# Patient Record
Sex: Female | Born: 1940 | Race: White | Hispanic: No | Smoking: Former smoker
Health system: Southern US, Community
[De-identification: ages and names within clinical notes are randomized; demographics above are authoritative.]

## PROBLEM LIST (undated history)

## (undated) DIAGNOSIS — G3184 Mild cognitive impairment, so stated: Secondary | ICD-10-CM

## (undated) DIAGNOSIS — I10 Essential (primary) hypertension: Secondary | ICD-10-CM

## (undated) DIAGNOSIS — G309 Alzheimer's disease, unspecified: Secondary | ICD-10-CM

## (undated) DIAGNOSIS — H259 Unspecified age-related cataract: Secondary | ICD-10-CM

## (undated) DIAGNOSIS — R1013 Epigastric pain: Secondary | ICD-10-CM

## (undated) DIAGNOSIS — R635 Abnormal weight gain: Secondary | ICD-10-CM

## (undated) DIAGNOSIS — K3189 Other diseases of stomach and duodenum: Secondary | ICD-10-CM

## (undated) DIAGNOSIS — D509 Iron deficiency anemia, unspecified: Secondary | ICD-10-CM

## (undated) DIAGNOSIS — R634 Abnormal weight loss: Secondary | ICD-10-CM

## (undated) DIAGNOSIS — E785 Hyperlipidemia, unspecified: Secondary | ICD-10-CM

## (undated) DIAGNOSIS — F329 Major depressive disorder, single episode, unspecified: Secondary | ICD-10-CM

## (undated) DIAGNOSIS — M1990 Unspecified osteoarthritis, unspecified site: Secondary | ICD-10-CM

## (undated) DIAGNOSIS — K5900 Constipation, unspecified: Secondary | ICD-10-CM

## (undated) DIAGNOSIS — J449 Chronic obstructive pulmonary disease, unspecified: Secondary | ICD-10-CM

## (undated) DIAGNOSIS — F0280 Dementia in other diseases classified elsewhere without behavioral disturbance: Secondary | ICD-10-CM

## (undated) DIAGNOSIS — F22 Delusional disorders: Secondary | ICD-10-CM

## (undated) DIAGNOSIS — M21619 Bunion of unspecified foot: Secondary | ICD-10-CM

## (undated) DIAGNOSIS — I2510 Atherosclerotic heart disease of native coronary artery without angina pectoris: Secondary | ICD-10-CM

## (undated) DIAGNOSIS — S62101A Fracture of unspecified carpal bone, right wrist, initial encounter for closed fracture: Secondary | ICD-10-CM

## (undated) HISTORY — DX: Unspecified osteoarthritis, unspecified site: M19.90

## (undated) HISTORY — DX: Abnormal weight gain: R63.5

## (undated) HISTORY — PX: COSMETIC SURGERY: SHX468

## (undated) HISTORY — DX: Constipation, unspecified: K59.00

## (undated) HISTORY — DX: Abnormal weight loss: R63.4

## (undated) HISTORY — DX: Bunion of unspecified foot: M21.619

## (undated) HISTORY — DX: Other diseases of stomach and duodenum: K31.89

## (undated) HISTORY — DX: Fracture of unspecified carpal bone, right wrist, initial encounter for closed fracture: S62.101A

## (undated) HISTORY — DX: Essential (primary) hypertension: I10

## (undated) HISTORY — DX: Iron deficiency anemia, unspecified: D50.9

## (undated) HISTORY — DX: Alzheimer's disease, unspecified: G30.9

## (undated) HISTORY — DX: Chronic obstructive pulmonary disease, unspecified: J44.9

## (undated) HISTORY — DX: Hyperlipidemia, unspecified: E78.5

## (undated) HISTORY — DX: Delusional disorders: F22

## (undated) HISTORY — DX: Epigastric pain: R10.13

## (undated) HISTORY — DX: Dementia in other diseases classified elsewhere without behavioral disturbance: F02.80

## (undated) HISTORY — DX: Major depressive disorder, single episode, unspecified: F32.9

## (undated) HISTORY — DX: Mild cognitive impairment, so stated: G31.84

## (undated) HISTORY — DX: Atherosclerotic heart disease of native coronary artery without angina pectoris: I25.10

## (undated) HISTORY — DX: Unspecified age-related cataract: H25.9

---

## 2003-05-15 HISTORY — PX: CORONARY ARTERY BYPASS GRAFT: SHX141

## 2005-05-14 HISTORY — PX: BREAST SURGERY: SHX581

## 2005-05-14 HISTORY — PX: BUNIONECTOMY: SHX129

## 2005-05-14 HISTORY — PX: CARPAL TUNNEL RELEASE: SHX101

## 2007-12-24 ENCOUNTER — Ambulatory Visit (HOSPITAL_COMMUNITY): Admission: RE | Admit: 2007-12-24 | Discharge: 2007-12-24 | Payer: Self-pay | Admitting: Geriatric Medicine

## 2009-11-12 ENCOUNTER — Emergency Department (HOSPITAL_COMMUNITY): Admission: EM | Admit: 2009-11-12 | Discharge: 2009-11-13 | Payer: Self-pay | Admitting: Emergency Medicine

## 2010-07-30 LAB — POCT CARDIAC MARKERS
CKMB, poc: 1.9 ng/mL (ref 1.0–8.0)
Myoglobin, poc: 85.2 ng/mL (ref 12–200)
Troponin i, poc: <0.05 ng/mL (ref 0.00–0.09)

## 2010-07-30 LAB — CBC
HCT: 37.2 % (ref 36.0–46.0)
MCH: 33.8 pg (ref 26.0–34.0)
MCV: 98.6 fL (ref 78.0–100.0)
RBC: 3.77 MIL/uL — ABNORMAL LOW (ref 3.87–5.11)
WBC: 6.2 10*3/uL (ref 4.0–10.5)

## 2010-07-30 LAB — LIPASE, BLOOD: Lipase: 33 U/L (ref 11–59)

## 2010-07-30 LAB — COMPREHENSIVE METABOLIC PANEL
ALT: 38 U/L — ABNORMAL HIGH (ref 0–35)
AST: 47 U/L — ABNORMAL HIGH (ref 0–37)
Albumin: 3.8 g/dL (ref 3.5–5.2)
CO2: 27 mEq/L (ref 19–32)
GFR calc Af Amer: >60 mL/min (ref >60)
GFR calc non Af Amer: >60 mL/min (ref >60)
Glucose, Bld: 86 mg/dL (ref 70–99)
Sodium: 141 mEq/L (ref 135–145)
Total Protein: 7 g/dL (ref 6.0–8.3)

## 2010-07-30 LAB — DIFFERENTIAL
Basophils Absolute: 0 10*3/uL (ref 0.0–0.1)
Eosinophils Relative: 3 % (ref 0–5)
Lymphocytes Relative: 44 % (ref 12–46)
Monocytes Relative: 9 % (ref 3–12)
Neutro Abs: 2.7 10*3/uL (ref 1.7–7.7)
Neutrophils Relative %: 44 % (ref 43–77)

## 2010-10-24 DIAGNOSIS — F3289 Other specified depressive episodes: Secondary | ICD-10-CM

## 2010-10-24 DIAGNOSIS — D509 Iron deficiency anemia, unspecified: Secondary | ICD-10-CM

## 2010-10-24 DIAGNOSIS — J4489 Other specified chronic obstructive pulmonary disease: Secondary | ICD-10-CM

## 2010-10-24 DIAGNOSIS — F329 Major depressive disorder, single episode, unspecified: Secondary | ICD-10-CM

## 2010-10-24 DIAGNOSIS — E785 Hyperlipidemia, unspecified: Secondary | ICD-10-CM

## 2010-10-24 DIAGNOSIS — I10 Essential (primary) hypertension: Secondary | ICD-10-CM

## 2010-10-24 DIAGNOSIS — J449 Chronic obstructive pulmonary disease, unspecified: Secondary | ICD-10-CM

## 2010-10-24 HISTORY — DX: Other specified depressive episodes: F32.89

## 2010-10-24 HISTORY — DX: Hyperlipidemia, unspecified: E78.5

## 2010-10-24 HISTORY — DX: Iron deficiency anemia, unspecified: D50.9

## 2010-10-24 HISTORY — DX: Other specified chronic obstructive pulmonary disease: J44.89

## 2010-10-24 HISTORY — DX: Chronic obstructive pulmonary disease, unspecified: J44.9

## 2010-10-24 HISTORY — DX: Essential (primary) hypertension: I10

## 2010-10-24 HISTORY — DX: Major depressive disorder, single episode, unspecified: F32.9

## 2010-10-25 DIAGNOSIS — R635 Abnormal weight gain: Secondary | ICD-10-CM

## 2010-10-25 HISTORY — DX: Abnormal weight gain: R63.5

## 2010-10-31 DIAGNOSIS — M21619 Bunion of unspecified foot: Secondary | ICD-10-CM

## 2010-10-31 DIAGNOSIS — G3184 Mild cognitive impairment, so stated: Secondary | ICD-10-CM

## 2010-10-31 DIAGNOSIS — H259 Unspecified age-related cataract: Secondary | ICD-10-CM

## 2010-10-31 DIAGNOSIS — I2510 Atherosclerotic heart disease of native coronary artery without angina pectoris: Secondary | ICD-10-CM | POA: Insufficient documentation

## 2010-10-31 HISTORY — DX: Bunion of unspecified foot: M21.619

## 2010-10-31 HISTORY — DX: Atherosclerotic heart disease of native coronary artery without angina pectoris: I25.10

## 2010-10-31 HISTORY — DX: Mild cognitive impairment of uncertain or unknown etiology: G31.84

## 2010-10-31 HISTORY — DX: Unspecified age-related cataract: H25.9

## 2010-11-13 DIAGNOSIS — F0280 Dementia in other diseases classified elsewhere without behavioral disturbance: Secondary | ICD-10-CM

## 2010-11-13 HISTORY — DX: Dementia in other diseases classified elsewhere, unspecified severity, without behavioral disturbance, psychotic disturbance, mood disturbance, and anxiety: F02.80

## 2011-03-14 DIAGNOSIS — F22 Delusional disorders: Secondary | ICD-10-CM

## 2011-03-14 HISTORY — DX: Delusional disorders: F22

## 2011-06-04 DIAGNOSIS — K3189 Other diseases of stomach and duodenum: Secondary | ICD-10-CM

## 2011-06-04 DIAGNOSIS — M1990 Unspecified osteoarthritis, unspecified site: Secondary | ICD-10-CM

## 2011-06-04 HISTORY — DX: Other diseases of stomach and duodenum: K31.89

## 2011-06-04 HISTORY — DX: Unspecified osteoarthritis, unspecified site: M19.90

## 2012-01-07 IMAGING — CR DG CHEST 1V PORT
1 series · 1 of 1 positions shown · non-contrast
Comparison: None.

CLINICAL DATA: Mid chest pain and shortness of breath; shakiness
and weakness.

PORTABLE CHEST - 1 VIEW

[series 1]
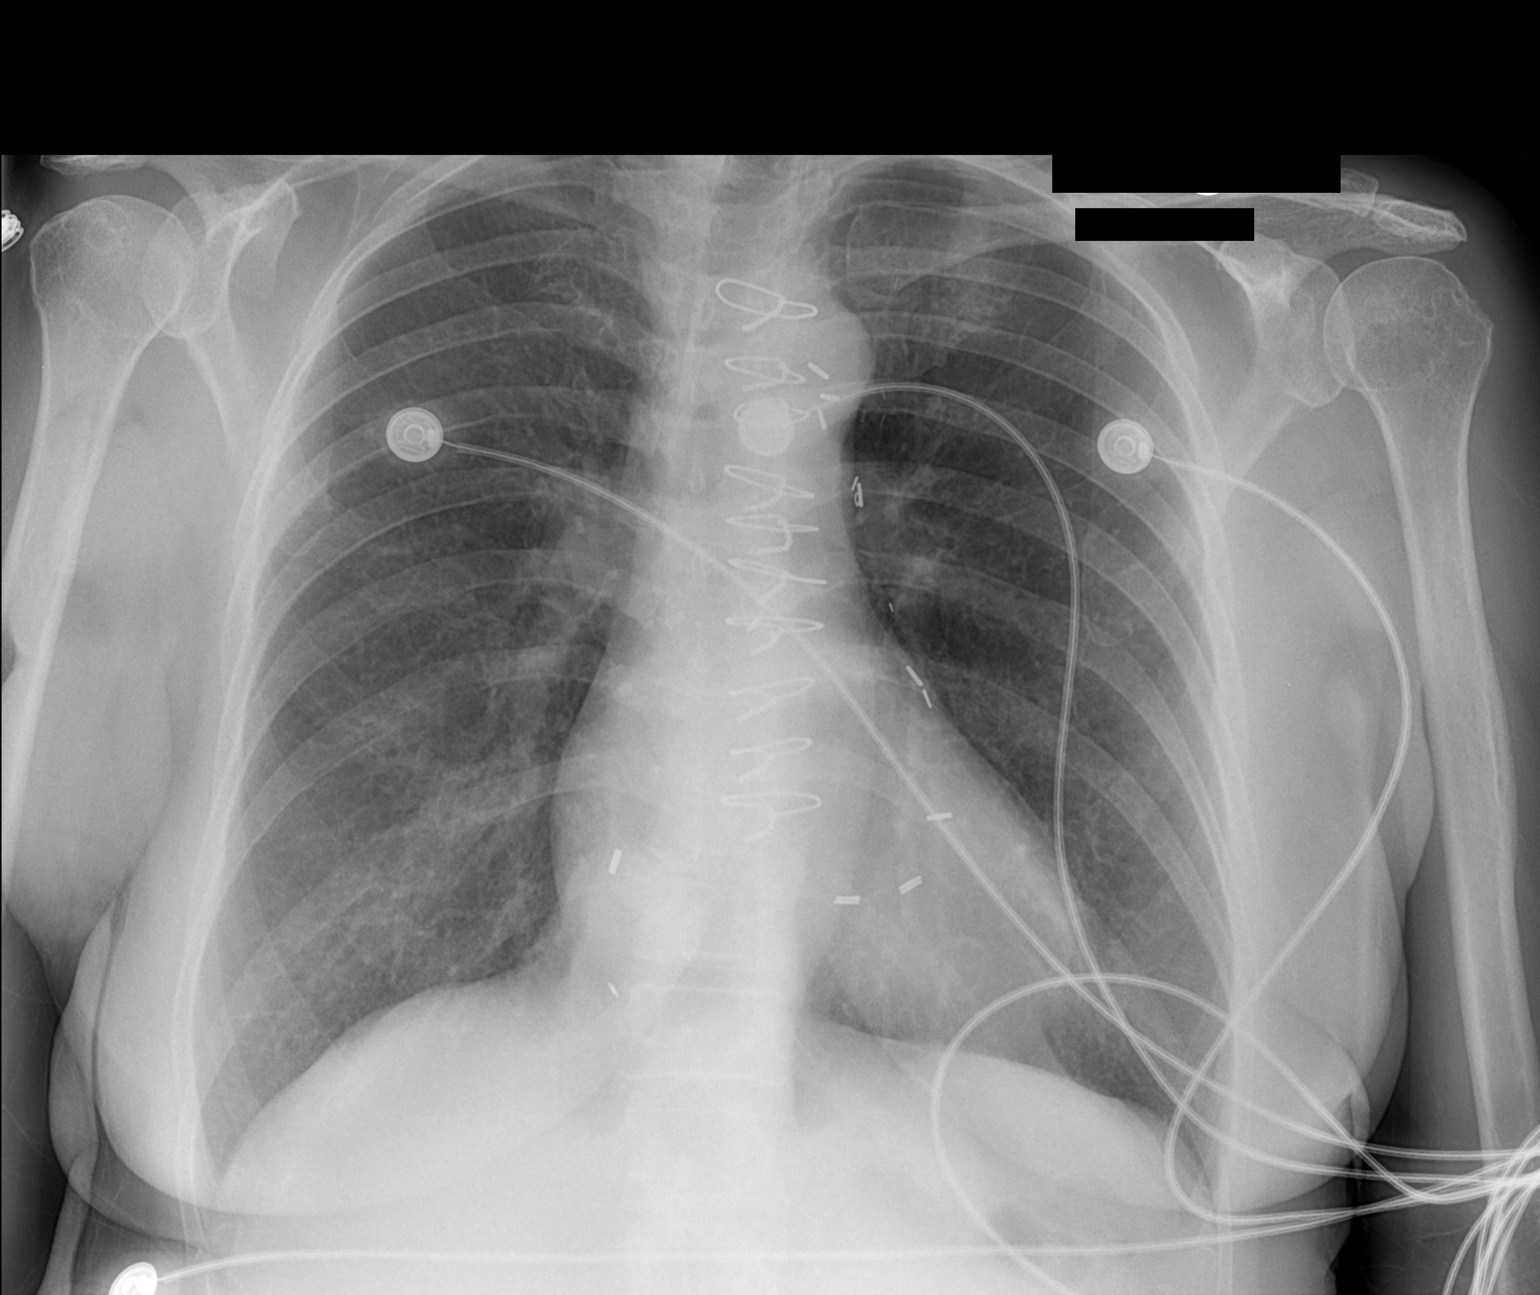

[1 of 1 positions shown; findings below may reference images not displayed]

FINDINGS: The lungs are well-aerated.  Mildly increased
interstitial markings are noted, of uncertain chronicity.  No
definite pulmonary edema is seen.  There is no evidence of focal
opacification, pleural effusion or pneumothorax.

The cardiomediastinal silhouette is normal in size; the patient is
status post median sternotomy.  No acute osseous abnormalities are
seen.
IMPRESSION: Mildly increased interstitial markings, possibly chronic in nature;
lungs otherwise clear.  No definite pulmonary edema seen.  The
heart remains normal in size.

## 2012-06-24 DIAGNOSIS — K5900 Constipation, unspecified: Secondary | ICD-10-CM | POA: Insufficient documentation

## 2012-06-24 DIAGNOSIS — R634 Abnormal weight loss: Secondary | ICD-10-CM | POA: Insufficient documentation

## 2012-06-24 HISTORY — DX: Abnormal weight loss: R63.4

## 2012-06-24 HISTORY — DX: Constipation, unspecified: K59.00

## 2012-10-17 ENCOUNTER — Encounter: Payer: Self-pay | Admitting: *Deleted

## 2012-11-25 ENCOUNTER — Non-Acute Institutional Stay (SKILLED_NURSING_FACILITY): Payer: Medicare Other | Admitting: Geriatric Medicine

## 2012-11-25 ENCOUNTER — Encounter: Payer: Self-pay | Admitting: Geriatric Medicine

## 2012-11-25 DIAGNOSIS — F0280 Dementia in other diseases classified elsewhere without behavioral disturbance: Secondary | ICD-10-CM

## 2012-11-25 DIAGNOSIS — E785 Hyperlipidemia, unspecified: Secondary | ICD-10-CM

## 2012-11-25 DIAGNOSIS — K5900 Constipation, unspecified: Secondary | ICD-10-CM

## 2012-11-25 DIAGNOSIS — G309 Alzheimer's disease, unspecified: Secondary | ICD-10-CM

## 2012-11-25 DIAGNOSIS — I2510 Atherosclerotic heart disease of native coronary artery without angina pectoris: Secondary | ICD-10-CM

## 2012-11-25 DIAGNOSIS — R634 Abnormal weight loss: Secondary | ICD-10-CM

## 2012-11-25 DIAGNOSIS — F3289 Other specified depressive episodes: Secondary | ICD-10-CM

## 2012-11-25 DIAGNOSIS — F329 Major depressive disorder, single episode, unspecified: Secondary | ICD-10-CM

## 2012-11-25 NOTE — Progress Notes (Signed)
Patient ID: Sharon Harper, female   DOB: [birth year], 72 y.o.   MRN: [id number] [facility] [phone])  Chief Complaint  Patient presents with  . Medical Managment of Chronic Issues    AD    HPI: This is a 72 y.o. female resident of [facility], Memory Care section evaluated today for management of ongoing medical issues.  Review of record shows patient's vital signs have been stable, p.o. intake has improved, weight has improved 10 pounds over the last few months. Patient is participating in some unit activities, continues to be active daily with her caregiver. Functional status is stable, though language is a bit more impaired.   Bathing: Moderate Assist Bed Mobility: Independent Bladder Management: Continent, Bowel Management: Continent Feeding: Agricultural engineer and Grooming: Supervision, Toileting / Clothing: Minimal Assist Transfers: Independent, Walk: Independent   Allergies no known allergies Medications    Medication List       This list is accurate as of: 11/25/12 11:59 PM.  Always use your most recent med list.               aspirin 81 MG tablet  Take 81 mg by mouth daily. Take one daily to prevent stroke, and heart attack, blood clot.     docusate sodium 100 MG capsule  Commonly known as:  COLACE  Take 100 mg by mouth 2 (two) times daily. Take 2 tablets once daily at bedtime for stool softener.     donepezil 10 MG tablet  Commonly known as:  ARICEPT  Take 10 mg by mouth at bedtime as needed. Take 1 daily for memory.     Fluticasone-Salmeterol 100-50 MCG/DOSE Aepb  Commonly known as:  ADVAIR  Inhale 1 puff into the lungs every 12 (twelve) hours. Take 1 puff twice daily.     LORazepam 0.5 MG tablet  Commonly known as:  ATIVAN  Take 0.5 mg by mouth every 8 (eight) hours. Take one tablet every 8 hours as needed for anxiety/agitation.     metoprolol succinate 25 MG 24 hr tablet  Commonly known as:  TOPROL-XL  Take 25 mg  by mouth daily. Take 1 daily for blood pressure.     mirtazapine 15 MG tablet  Commonly known as:  REMERON  Take 15 mg by mouth at bedtime. Take 1 tablet at bedtime to increase appetite, decrease anxiety.     multivitamin tablet  Take 1 tablet by mouth daily. Take 1 tablet daily for vitamin supplement.     NAMENDA XR 28 MG Cp24  Generic drug:  Memantine HCl ER  Take by mouth. Take 1 daily to help maintain functional status.     ranitidine 75 MG tablet  Commonly known as:  ZANTAC  Take 75 mg by mouth 2 (two) times daily. Take 1 tablet daily for GERD.        DATA REVIEWED  Radiologic Exams:   Cardiovascular Exams:   Laboratory Studies: 02/21/12 WBC 5.8, Hgb 11.9, Hct 34.7, Plt 179 MCH 32.8, MCV 95.6 Glu 104, BUN 9, Cr. .86, Na 145, K+ 4.0. protein/LFTs WNL TC 167. TG 52, HDL 75, LDL 82 TSH 2.32  Review of Systems   DATA OBTAINED: from patient, nurse, medical record,  GENERAL: Feels well   No fevers, fatigue, Increased appetite/ weight SKIN: No itch, rash or open wounds EYES: No eye pain, dryness or itching  No change in vision EARS: No earache, change in hearing NOSE: No congestion, drainage or bleeding MOUTH/THROAT: No mouth or tooth  pain  No sore throat No difficulty chewing or swallowing RESPIRATORY: No cough, wheezing, SOB CARDIAC: No chest pain, palpitations  No edema. GI: No abdominal pain  No N/V/D or constipation  No heartburn or reflux  GU: No dysuria, frequency or urgency  No change in urine volume or character  MUSCULOSKELETAL: No joint pain, swelling or stiffness  No back pain  No muscle ache, pain, weakness  Gait is steady  No recent falls.  NEUROLOGIC: No dizziness, fainting, headache No change in mental status (dementia).  PSYCHIATRIC: No feelings of anxiety, depression Sleeps well.  No behavior issue.   Physical Exam Filed Vitals:   11/25/12 1154  BP: 96/59  Pulse: 82  Temp: 98.3 F (36.8 C)  Resp: 18  Weight: 131 lb (59.421 kg)   There is no  height on file to calculate BMI.  GENERAL APPEARANCE: No acute distress, appropriately groomed, normal body habitus. Alert, pleasant, conversant.Word finding difficulty evident SKIN: No diaphoresis, rash, unusual lesions, wounds HEAD: Normocephalic, atraumatic EYES: Conjunctiva/lids clear. Pupils round, reactive.  EARS: External exam WNL,  Hearing grossly normal. NOSE: No deformity or discharge. MOUTH/THROAT: Lips w/o lesions. Oral mucosa, tongue moist, w/o lesion. Oropharynx w/o redness or lesions.  NECK: Supple, full ROM. No thyroid tenderness, enlargement or nodule LYMPHATICS: No head, neck or supraclavicular adenopathy RESPIRATORY: Breathing is even, unlabored. Lung sounds are clear and full.  CARDIOVASCULAR: Heart RRR. No murmur or extra heart sounds  ARTERIAL: No carotid, bruit.  DP pulse 2+.  VENOUS: No varicosities. No venous stasis skin changes  EDEMA: No peripheral edema.  GASTROINTESTINAL: Abdomen is soft, non-tender, not distended w/ normal bowel sounds. MUSCULOSKELETAL: Moves all extremities with full ROM, strength and tone. Back is without kyphosis, scoliosis or spinal process tenderness. Gait is steady NEUROLOGIC: Not oriented to time, place. Speech clear, language impaired. No tremor.   PSYCHIATRIC: Mood and affect appropriate to situation  ASSESSMENT/PLAN   Alzheimer's disease Reviewed most recent MDS, 09/2012: BIMS 4/13, PHQ-9 1/27. Some wandering behavior is noted. Patient's functional status remains stable; she requires supervision or limited assistance with ADLs. She continues to ambulate safely independently. Language is more impaired, continues to be able to make needs known. Patient's overall demeanor is very good, markedly less anxious. P.O. intake and weight have improved. Mirtazapine was added 3/ 2014 may be helping both appetite and anxiety. Patient participates in some in activities, continues to have a caregiver  each afternoon, family remains involved, visit  freqiuently.  Coronary atherosclerosis of native coronary artery No complaints of chest pain or irregular heart rhythm. Blood pressure/pulse remain stable. Continue low-dose beta blocker.  Loss of weight Improved p.o. intake over the last several months, weight is up 10 pounds since April 2014. Mirtazapine was added March 2014, continue medication.    Follow up: Routine or as needed Lab 11/27/2012 CBC, CMP, TSH  Liliana Brentlinger T.Samit Sylve, NP-C 11/25/2012

## 2012-12-04 ENCOUNTER — Encounter: Payer: Self-pay | Admitting: Geriatric Medicine

## 2012-12-04 ENCOUNTER — Other Ambulatory Visit: Payer: Self-pay | Admitting: Geriatric Medicine

## 2012-12-04 DIAGNOSIS — Z66 Do not resuscitate: Secondary | ICD-10-CM

## 2012-12-04 NOTE — Assessment & Plan Note (Signed)
Improved p.o. intake over the last several months, weight is up 10 pounds since April 2014. Mirtazapine was added March 2014, continue medication.

## 2012-12-04 NOTE — Assessment & Plan Note (Signed)
No complaints of chest pain or irregular heart rhythm. Blood pressure/pulse remain stable. Continue low-dose beta blocker.

## 2012-12-04 NOTE — Assessment & Plan Note (Addendum)
Reviewed most recent MDS, 09/2012: BIMS 4/13, PHQ-9 1/27. Some wandering behavior is noted. Patient's functional status remains stable; she requires supervision or limited assistance with ADLs. She continues to ambulate safely independently. Language is more impaired, continues to be able to make needs known. Patient's overall demeanor is very good, markedly less anxious. P.O. intake and weight have improved. Mirtazapine was added 3/ 2014 may be helping both appetite and anxiety. Patient participates in some in activities, continues to have a caregiver  each afternoon, family remains involved, visit freqiuently.

## 2013-03-08 DIAGNOSIS — J189 Pneumonia, unspecified organism: Secondary | ICD-10-CM | POA: Insufficient documentation

## 2013-03-10 ENCOUNTER — Non-Acute Institutional Stay: Payer: Medicare Other | Admitting: Geriatric Medicine

## 2013-03-10 ENCOUNTER — Encounter: Payer: Self-pay | Admitting: Geriatric Medicine

## 2013-03-10 DIAGNOSIS — J189 Pneumonia, unspecified organism: Secondary | ICD-10-CM

## 2013-03-10 DIAGNOSIS — N390 Urinary tract infection, site not specified: Secondary | ICD-10-CM

## 2013-03-10 NOTE — Progress Notes (Signed)
Patient ID: Sharon Harper, female   DOB: 27-Oct-1940, 72 y.o.   MRN: [id number] [facility] [phone])  Chief Complaint  Patient presents with  . Pneumonia    HPI: This is a 72 y.o. female resident of [facility],  Memory Care section evaluated today in follow up of pneumonia diagnosed by CXR 03/08/13. Review of facility record shows patient with increased agitation 10/23, 10/24. Gait noted to be unsteady and shuffling starting 10/24, cough started 10/26, had incontinent urine episode and low grade fever. OnCall provider was notified, CXR U/A ordered. Avelox started.  Today, patient is afebrile, minimal coughing, gait improved though not back to baseline. Mood remains negative.    Functional Status Bathing: Moderate Assist Bed Mobility: Independent Bladder Management: Continent, Bowel Management: Continent Feeding: Agricultural engineer and Grooming: Supervision, Toileting / Clothing: Minimal Assist Transfers: Independent, Walk: Independent   No Known Allergies    Medication List       This list is accurate as of: 03/10/13  2:54 PM.  Always use your most recent med list.               aspirin 81 MG tablet  Take 81 mg by mouth daily. Take one daily to prevent stroke, and heart attack, blood clot.     docusate sodium 100 MG capsule  Commonly known as:  COLACE  Take 100 mg by mouth 2 (two) times daily. Take 2 tablets once daily at bedtime for stool softener.     donepezil 10 MG tablet  Commonly known as:  ARICEPT  Take 10 mg by mouth at bedtime as needed. Take 1 daily for memory.     Fluticasone-Salmeterol 100-50 MCG/DOSE Aepb  Commonly known as:  ADVAIR  Inhale 1 puff into the lungs every 12 (twelve) hours. Take 1 puff twice daily.     LORazepam 0.5 MG tablet  Commonly known as:  ATIVAN  Take 0.5 mg by mouth every 8 (eight) hours. Take one tablet every 8 hours as needed for anxiety/agitation.     metoprolol succinate 25 MG 24 hr  tablet  Commonly known as:  TOPROL-XL  Take 25 mg by mouth daily. Take 1 daily for blood pressure.     mirtazapine 15 MG tablet  Commonly known as:  REMERON  Take 15 mg by mouth at bedtime. Take 1 tablet at bedtime to increase appetite, decrease anxiety.     multivitamin tablet  Take 1 tablet by mouth daily. Take 1 tablet daily for vitamin supplement.     NAMENDA XR 28 MG Cp24  Generic drug:  Memantine HCl ER  Take by mouth. Take 1 daily to help maintain functional status.     ranitidine 75 MG tablet  Commonly known as:  ZANTAC  Take 75 mg by mouth 2 (two) times daily. Take 1 tablet daily for GERD.        DATA REVIEWED  Radiologic Exams  [facility] X-ray 03/08/2013 CXR: Dense infiltrate above left hemidiaphragm c/w PNA. No effusion  Cardiovascular Exams:   Laboratory Studies: 02/21/12 WBC 5.8, Hgb 11.9, Hct 34.7, Plt 179 MCH 32.8, MCV 95.6 Glu 104, BUN 9, Cr. .86, Na 145, K+ 4.0. protein/LFTs WNL TC 167. TG 52, HDL 75, LDL 82 TSH 2.32  11/27/2012: WBC 6.9, Hgb 12.5, Hct 36.8, Plt 254  Glu 90, BUN 16, Cr. .93, na 142, K+ 4.3. Protein/ LFTs WNL  TSH 2.61  03/10/2013 urine cx: >100,000 E. Coli Review of Systems   DATA OBTAINED: from patient,  nurse, medical record,  GENERAL: Feels "better"  No fevers, fatigue, Appetite fair SKIN: No itch, rash or open wounds EYES: No eye pain, dryness or itching  No change in vision EARS: No earache, change in hearing NOSE: No congestion, drainage or bleeding MOUTH/THROAT: No mouth or tooth pain  No sore throat No difficulty chewing or swallowing RESPIRATORY: Cough persists, No wheezing, SOB CARDIAC: No chest pain, palpitations  No edema. GI: No abdominal pain  No N/V/D or constipation  No heartburn or reflux  GU: No dysuria, frequency or urgency  No change in urine volume or character  MUSCULOSKELETAL: No joint pain, swelling or stiffness  No back pain  No muscle ache, pain, weakness  Gait is steady with walker  No recent falls.   NEUROLOGIC: No dizziness, fainting, headache No change in mental status (dementia).  PSYCHIATRIC: Mild agitation  Sleeps well.  No behavior issue.   Physical Exam Filed Vitals:   03/10/13 1453  BP: 130/62  Pulse: 88  Temp: 96.8 F (36 C)  Resp: 16   There is no weight on file to calculate BMI.  GENERAL APPEARANCE: No acute distress, appropriately groomed, normal body habitus. Alert, pleasant, conversant.Word finding difficulty evident SKIN: No diaphoresis, rash, unusual lesions, wounds HEAD: Normocephalic, atraumatic EYES: Conjunctiva/lids clear. Pupils round, reactive.  EARS: External exam WNL,  Hearing grossly normal. NOSE: No deformity or discharge. MOUTH/THROAT: Lips w/o lesions. Oral mucosa, tongue moist, w/o lesion. Oropharynx w/o redness or lesions.  NECK: Supple, full ROM. No thyroid tenderness, enlargement or nodule LYMPHATICS: No head, neck or supraclavicular adenopathy RESPIRATORY: Breathing is even, unlabored. Lung sounds are clear and full.  CARDIOVASCULAR: Heart RRR. No murmur or extra heart sounds  EDEMA: No peripheral edema.  GASTROINTESTINAL: Abdomen is soft, non-tender, not distended w/ normal bowel sounds. MUSCULOSKELETAL: Moves all extremities with full ROM, strength and tone. Back is without kyphosis, scoliosis or spinal process tenderness. Gait is steady w/ walker NEUROLOGIC: Not oriented to time, place. Speech clear, language impaired. No tremor.   PSYCHIATRIC: Mild agitation    ASSESSMENT/PLAN   HCAP (healthcare-associated pneumonia) Cough, gait and appetite improving. Continue Avelox for 10 days  Urinary tract infection, site not specified UTI with E.Coli, sensitive to cipro/levofloxacin. Avelox will cover, continue for full 10 days.    Follow up: Routine or as needed  Sharon Satterly T.[name], NP-C 03/10/2013

## 2013-03-10 NOTE — Assessment & Plan Note (Addendum)
Cough, gait and appetite improving. Continue Avelox for 10 days

## 2013-03-12 DIAGNOSIS — F0391 Unspecified dementia with behavioral disturbance: Secondary | ICD-10-CM | POA: Insufficient documentation

## 2013-03-15 DIAGNOSIS — N390 Urinary tract infection, site not specified: Secondary | ICD-10-CM | POA: Insufficient documentation

## 2013-03-15 NOTE — Assessment & Plan Note (Signed)
UTI with E.Coli, sensitive to cipro/levofloxacin. Avelox will cover, continue for full 10 days.

## 2013-04-17 LAB — BASIC METABOLIC PANEL
BUN: 12 mg/dL (ref 4–21)
CREATININE: 0.8 mg/dL (ref 0.5–1.1)
GLUCOSE: 98 mg/dL
Potassium: 3.8 mmol/L (ref 3.4–5.3)
SODIUM: 140 mmol/L (ref 137–147)

## 2013-04-17 LAB — HEPATIC FUNCTION PANEL
ALT: 15 U/L (ref 7–35)
AST: 23 U/L (ref 13–35)
Alkaline Phosphatase: 93 U/L (ref 25–125)

## 2013-04-17 LAB — CBC AND DIFFERENTIAL
HEMATOCRIT: 37 % (ref 36–46)
HEMOGLOBIN: 12.7 g/dL (ref 12.0–16.0)
PLATELETS: 298 10*3/uL (ref 150–399)
WBC: 5.7 10*3/mL

## 2013-04-17 LAB — TSH: TSH: 3.24 u[IU]/mL (ref 0.41–5.90)

## 2013-06-09 ENCOUNTER — Encounter: Payer: Self-pay | Admitting: Adult Health

## 2013-06-09 ENCOUNTER — Encounter: Payer: Self-pay | Admitting: Geriatric Medicine

## 2013-06-09 ENCOUNTER — Non-Acute Institutional Stay: Payer: Medicare Other | Admitting: Adult Health

## 2013-06-09 DIAGNOSIS — R269 Unspecified abnormalities of gait and mobility: Secondary | ICD-10-CM

## 2013-06-09 DIAGNOSIS — R0602 Shortness of breath: Secondary | ICD-10-CM

## 2013-06-09 DIAGNOSIS — F329 Major depressive disorder, single episode, unspecified: Secondary | ICD-10-CM

## 2013-06-09 DIAGNOSIS — G309 Alzheimer's disease, unspecified: Principal | ICD-10-CM

## 2013-06-09 DIAGNOSIS — F03918 Unspecified dementia, unspecified severity, with other behavioral disturbance: Secondary | ICD-10-CM

## 2013-06-09 DIAGNOSIS — R609 Edema, unspecified: Secondary | ICD-10-CM

## 2013-06-09 DIAGNOSIS — F0280 Dementia in other diseases classified elsewhere without behavioral disturbance: Secondary | ICD-10-CM

## 2013-06-09 DIAGNOSIS — F3289 Other specified depressive episodes: Secondary | ICD-10-CM

## 2013-06-09 DIAGNOSIS — F0391 Unspecified dementia with behavioral disturbance: Secondary | ICD-10-CM

## 2013-06-09 NOTE — Assessment & Plan Note (Signed)
06/09/13: increasing agitation and irritability. Depakote reduced to 250mg  from 500mg  to see if improves

## 2013-06-09 NOTE — Assessment & Plan Note (Signed)
06/09/13: pt with gait difficulty dating back to October 2014 prior to depakote order, however, nursing and friend, carolyn, reported marked decline in ability to ambulate safely, with at times requiring walker and physical assistance. Medication vs. Progressive dementia. Will consider PT if needed. Continue supervison with ambulation and walker when needed. Decrease depakote to 250mg  from 500mg  daily

## 2013-06-09 NOTE — Assessment & Plan Note (Signed)
06/09/13: increased crying and irritability at times. Medication vs. Progressive dementia. Will decrease depakote to 250mg  from 500mg  daily to see if mood improves

## 2013-06-09 NOTE — Progress Notes (Signed)
Patient ID: Sharon Harper, female   DOB: 25-Feb-1941, 73 y.o.   MRN: [id number] [facility] [phone])  Chief Complaint  Patient presents with  . Medical Managment of Chronic Issues    annual update    HPI: This is 73 y.o. Patient in memory care assessed for annual comprehensive exam. Staff reports increased difficulty in walking resulting in 1 fall without injury on 05/24/13, and several other near fall incidents. Also reported is pt difficulty understanding how to take her pills and becoming agitated. Pt friend, Sharon Harper, is at side and reports pt has had some more crying spells in the last several months, along with her notable walking difficulty. She has had to assist pt with use of walker at times due to severe unsteady gait. She notes patient has never returned to baseline mood and walking since having UTI and pneumonia in October 2014. It is also reported that pt has become more sob with minimal activity in recent weeks  Functional Status Bathing: Moderate Assist Bed Mobility: Minimal Assist Bladder Management: Frequently incontinent, Bowel Management: Continent Feeding: Supervision Hygiene and Grooming: Supervision, Toileting / Clothing: Minimal Assist Transfers: Supervision, Walk: Supervision/Minimal to moderate assistance   No Known Allergies    Medication List       This list is accurate as of: 06/09/13  4:38 PM.  Always use your most recent med list.               aspirin 81 MG tablet  Take 81 mg by mouth daily. Take one daily to prevent stroke, and heart attack, blood clot.     divalproex 250 MG 24 hr tablet  Commonly known as:  DEPAKOTE ER  Take 250 mg by mouth every morning.     docusate sodium 100 MG capsule  Commonly known as:  COLACE  Take 100 mg by mouth 2 (two) times daily. Take 2 tablets once daily at bedtime for stool softener.     donepezil 10 MG tablet  Commonly known as:  ARICEPT  Take 10 mg by mouth at bedtime. Take 1 daily  for memory.     Fluticasone-Salmeterol 100-50 MCG/DOSE Aepb  Commonly known as:  ADVAIR  Inhale 1 puff into the lungs every 12 (twelve) hours. Take 1 puff twice daily.     LORazepam 0.5 MG tablet  Commonly known as:  ATIVAN  Take 0.5 mg by mouth every 8 (eight) hours. Take one tablet every 8 hours as needed for anxiety/agitation.     metoprolol succinate 25 MG 24 hr tablet  Commonly known as:  TOPROL-XL  Take 25 mg by mouth daily. Take 1 daily for blood pressure.     mirtazapine 15 MG tablet  Commonly known as:  REMERON  Take 15 mg by mouth at bedtime. Take 1 tablet at bedtime to increase appetite, decrease anxiety.     multivitamin tablet  Take 1 tablet by mouth daily. Take 1 tablet daily for vitamin supplement.     NAMENDA XR 28 MG Cp24  Generic drug:  Memantine HCl ER  Take by mouth every morning. Take 1 daily to help maintain functional status.     ranitidine 75 MG tablet  Commonly known as:  ZANTAC  Take 75 mg by mouth 2 (two) times daily. Take 1 tablet daily for GERD.        DATA REVIEWED  Radiologic Exams  [facility] X-ray 03/08/2013 CXR: Dense infiltrate above left hemidiaphragm c/w PNA. No effusion  Cardiovascular Exams:  Laboratory Studies: 02/21/12 WBC 5.8, Hgb 11.9, Hct 34.7, Plt 179 MCH 32.8, MCV 95.6 Glu 104, BUN 9, Cr. .86, Na 145, K+ 4.0. protein/LFTs WNL TC 167. TG 52, HDL 75, LDL 82 TSH 2.32  11/27/2012: WBC 6.9, Hgb 12.5, Hct 36.8, Plt 254  Glu 90, BUN 16, Cr. .93, na 142, K+ 4.3. Protein/ LFTs WNL  TSH 2.61  03/10/2013 urine cx: >100,000 E. Coli Lab Results- [facility] 04/17/2013  Component Value   WBC 5.7   HGB 12.7   HCT 37   PLT 298       GLUCOSE 98   ALT 15   AST 23   NA 140   K 3.8   CREATININE 0.8   BUN 12   albumin 3.8   GFR 71       Valproic Acid 46   TSH 3.24     Past Medical History  Diagnosis Date  . Unspecified constipation 06/24/2012  . Loss of weight 06/24/2012  . Dyspepsia and other specified disorders of  function of stomach 06/04/2011  . Osteoarthrosis, unspecified whether generalized or localized, unspecified site 06/04/2011  . Unspecified paranoid state 03/14/2011  . Alzheimer's disease 11/13/2010  . Mild cognitive impairment, so stated 10/31/2010  . Senile cataract, unspecified 10/31/2010  . Coronary atherosclerosis of native coronary artery 10/31/2010  . Bunion 10/31/2010  . Abnormal weight gain 10/25/2010  . Other and unspecified hyperlipidemia 10/24/2010  . Iron deficiency anemia, unspecified 10/24/2010  . Depressive disorder, not elsewhere classified 10/24/2010  . Unspecified essential hypertension 10/24/2010  . Chronic airway obstruction, not elsewhere classified 10/24/2010   Past Surgical History  Procedure Laterality Date  . Cosmetic surgery  2000-2005    multiple surgeries  . Coronary artery bypass graft  2005  . Carpal tunnel release  2007  . Breast surgery  2007    Breast reduction  . Bunionectomy Bilateral 2007   Family Status  Relation Status Death Age  . Brother Deceased [id number]    Alzheimers  . Daughter Alive    History   Social History Narrative   Divorced. Retired, Research officer, political partye[employer] Estate [facility]. Patient has Advanced planning documents:  Living Will, DNR    Stopped smoking 1983, minimal alcohol history          REVIEW OF SYSTEMS DATA OBTAINED: from patient, nurse, medical record,  GENERAL: Feels "fantastic" , eating well Friend, Sharon JonesCarolyn, states pt has had more crying spells, but none in about a week SKIN: No itch, rash or open wounds EYES: No eye pain, dryness or itching  No change in vision EARS: No earache, change in hearing NOSE: No congestion, drainage or bleeding MOUTH/THROAT: No mouth or tooth pain  No sore throat No difficulty chewing or swallowing RESPIRATORY:  SOB with minimal to mod activity CARDIAC: No chest pain, palpitations    GI: No abdominal pain  No N/V/D or constipation  No  heartburn or reflux  GU: No dysuria, frequency or urgency  No change in urine volume or character, frequent incontinence  MUSCULOSKELETAL: recent fall, unsteady gait No joint pain, swelling or stiffness  No back pain  No muscle ache, pain, weakness   NEUROLOGIC: No dizziness, fainting, headache No change in mental status (dementia).  PSYCHIATRIC: Mild agitation, crying spells, irritable with staff at medication time  Sleeps well.   PHYSICAL EXAM  Filed Vitals:   06/09/13 1523  BP: 106/63  Pulse: 70  Temp: 97.2 F (36.2 C)  Resp:  16  Weight: 147 lb 8 oz (66.906 kg)  SpO2: 96%   Body mass index is 25.31 kg/(m^2).  GENERAL APPEARANCE: No acute distress, appropriately groomed, normal body habitus. Alert, pleasant, conversant. Gross word finding difficulty SKIN: No diaphoresis, rash, unusual lesions, wounds HEAD: Normocephalic, atraumatic EYES: Conjunctiva/lids clear. Pupils round, reactive. EARS: External exam WNL,  Hearing grossly normal. NOSE: No deformity or discharge. MOUTH/THROAT: Lips w/o lesions. Oral mucosa, tongue moist, w/o lesion. Oropharynx w/o redness or lesions.  NECK: Supple, full ROM. No thyroid tenderness, enlargement or nodule LYMPHATICS: No head, neck or supraclavicular adenopathy BREAST EXAM: No lumps/masses, nontender, no dimpling, nipple retraction. Old scarring from previous surgery RESPIRATORY: SOB with walking, putting on sweater. Lung sounds are clear and full.  CARDIOVASCULAR: Heart RRR. No murmur or extra heart sounds.   EDEMA: bilateral LE edema right (1+) greater than left (trace), nontender, no abnormal coloring  GASTROINTESTINAL: Abdomen is soft, non-tender, not distended w/ normal bowel sounds. 5 lb weight gain in 2 months. Eating well MUSCULOSKELETAL: Moves all extremities with full ROM, strength and tone. Back is without kyphosis, scoliosis or spinal process tenderness. Staggering gait alternating rapid to normal pace, leaning and pulling toward the  right, head ahead of feet NEUROLOGIC: Not oriented to time, place. Speech clear, language impaired. No tremor.   PSYCHIATRIC: very pleasant and cooperative during assessment    ASSESSMENT/PLAN   Alzheimer's disease 06/09/13: Reviewed MDS: BIMS 3/15, PHQ-9 0/0-this may not be an appropriate tool for this pt due to gross language difficulty. Has had increasing crying episodes per nursing and friend, carolyn. Increased anxiety with medication administration. Unable to follow directions for pill administration causing agitation. Functional status: requiring more assistance with dressing and walking. Supervision with eating, frequent bladder incontinence. Very good appetite  Depressive disorder, not elsewhere classified 06/09/13: increased crying and irritability at times. Medication vs. Progressive dementia. Will decrease depakote to 250mg  from 500mg  daily to see if mood improves  Dementia with behavioral disturbance 06/09/13: increasing agitation and irritability. Depakote reduced to 250mg  from 500mg  to see if improves  Edema 06/09/13: new bilateral LE edema noted. Right greater than left. May related to increased inactivity in light of recent decline in ambulation, but pt has a hx of CAD with bypass also. Will order chest xray  SOB (shortness of breath) 06/09/13: recent increase sob with walking and other minimal activty. Inactivity/deconditioning vs CAD. Chest xray ordered  Gait disturbance 06/09/13: pt with gait difficulty dating back to October 2014 prior to depakote order, however, nursing and friend, carolyn, reported marked decline in ability to ambulate safely, with at times requiring walker and physical assistance. Medication vs. Progressive dementia. Will consider PT if needed. Continue supervison with ambulation and walker when needed. Decrease depakote to 250mg  from 500mg  daily    Follow up: Routine or as needed  CXR today  Claudette T.Krell, NP-C/[facility]  student 06/09/2013

## 2013-06-09 NOTE — Assessment & Plan Note (Signed)
06/09/13: recent increase sob with walking and other minimal activty. Inactivity/deconditioning vs CAD. Chest xray ordered

## 2013-06-09 NOTE — Assessment & Plan Note (Signed)
06/09/13: Reviewed MDS: BIMS 3/15, PHQ-9 0/0-this may not be an appropriate tool for this pt due to gross language difficulty. Has had increasing crying episodes per nursing and friend, carolyn. Increased anxiety with medication administration. Unable to follow directions for pill administration causing agitation. Functional status: requiring more assistance with dressing and walking. Supervision with eating, frequent bladder incontinence. Very good appetite

## 2013-06-09 NOTE — Assessment & Plan Note (Signed)
06/09/13: new bilateral LE edema noted. Right greater than left. May related to increased inactivity in light of recent decline in ambulation, but pt has a hx of CAD with bypass also. Will order chest xray

## 2013-07-28 ENCOUNTER — Encounter: Payer: Self-pay | Admitting: Adult Health

## 2013-07-28 ENCOUNTER — Non-Acute Institutional Stay (SKILLED_NURSING_FACILITY): Payer: Medicare Other | Admitting: Adult Health

## 2013-07-28 DIAGNOSIS — G309 Alzheimer's disease, unspecified: Secondary | ICD-10-CM

## 2013-07-28 DIAGNOSIS — F0280 Dementia in other diseases classified elsewhere without behavioral disturbance: Secondary | ICD-10-CM

## 2013-07-28 DIAGNOSIS — F0391 Unspecified dementia with behavioral disturbance: Secondary | ICD-10-CM

## 2013-07-28 DIAGNOSIS — R0602 Shortness of breath: Secondary | ICD-10-CM

## 2013-07-28 DIAGNOSIS — R609 Edema, unspecified: Secondary | ICD-10-CM

## 2013-07-28 DIAGNOSIS — F03918 Unspecified dementia, unspecified severity, with other behavioral disturbance: Secondary | ICD-10-CM

## 2013-07-28 DIAGNOSIS — R269 Unspecified abnormalities of gait and mobility: Secondary | ICD-10-CM

## 2013-07-28 NOTE — Progress Notes (Signed)
Patient ID: Sharon Harper, female   DOB: [birth year], 73 y.o.   MRN: [id number]   [facility] (31)  Code Status: DNR  HPI:  This is a 73 y.o.female living on [facility] care [facility] being seen today for follow up of chronic medical issues. Staff reports noticing increased LLE edema around ankle since yesterday that is not affecting functionality or associated with any SOB. She has a compression stocking in place and staff are encouraging elevation of LE, however, with her STM problems this is difficult.   Mood has been stable for about 2 weeks, but staff reports patient seems to have 3-4 days of increased agitation and crying every 2-3 weeks that spontaneously resolves. Nursing staff cannot associate these spells with specific activities or events and there is poor documentation of events surrounding these episodes as well.  Has required prn ativan only 1x in the last 90month on 06/26/13 when she could not be redirected or calmed Patient has been ambulating more consistently with her walker when staff is encouraging her. She is very steady when using walker. She had a fall without injury on 07/12/13 when she was found lying on her back in the hall, laughing, and walker was not within reach. She was not injured.  Last Visit 06/09/13: Alzheimer's disease  06/09/13: Reviewed MDS: BIMS 3/15, PHQ-9 0/0-this may not be an appropriate tool for this pt due to gross language difficulty. Has had increasing crying episodes per nursing and friend, carolyn. Increased anxiety with medication administration. Unable to follow directions for pill administration causing agitation. Functional status: requiring more assistance with dressing and walking. Supervision with eating, frequent bladder incontinence. Very good appetite  Depressive disorder, not elsewhere classified  06/09/13: increased crying and irritability at times. Medication vs. Progressive dementia. Will decrease depakote to 2521mfrom  50074maily to see if mood improves  Dementia with behavioral disturbance  06/09/13: increasing agitation and irritability. Depakote reduced to 250m23mom 500mg41msee if improves  Edema  06/09/13: new bilateral LE edema noted. Right greater than left. May related to increased inactivity in light of recent decline in ambulation, but pt has a hx of CAD with bypass also. Will order chest xray  SOB (shortness of breath)  06/09/13: recent increase sob with walking and other minimal activty. Inactivity/deconditioning vs CAD. Chest xray ordered  Gait disturbance  06/09/13: pt with gait difficulty dating back to October 2014 prior to depakote order, however, nursing and friend, carolyn, reported marked decline in ability to ambulate safely, with at times requiring walker and physical assistance. Medication vs. Progressive dementia. Will consider PT if needed. Continue supervison with ambulation and walker when needed. Decrease depakote to 250mg 23m 500mg d58m     No Known Allergies   DATA REVIEWED  Radiologic Exams:  Chest xray 06/09/13: mild basilar atelectasis; no pulmonary vascular congestion, effusion, consolidation, infiltrates    Laboratory Studies: Lab Results  Component Value Date   WBC 5.7 04/17/2013   HGB 12.7 04/17/2013   HCT 37 04/17/2013   MCV 98.6 11/12/2009   PLT 298 04/17/2013   Lab Results  Component Value Date   NA 140 04/17/2013   K 3.8 04/17/2013   GLU 98 04/17/2013   BUN 12 04/17/2013   CREATININE 0.8 04/17/2013   Lab Results  Component Value Date   CALCIUM 10.1 11/12/2009   ALBUMIN 3.8 11/12/2009   AST 23 04/17/2013   ALT 15 04/17/2013   ALKPHOS 93 04/17/2013   BILITOT 0.9 11/12/2009  GFRNONAA >60 11/12/2009   GFRAA  Value: >60        The eGFR has been calculated using the MDRD equation. This calculation has not been validated in all clinical situations. eGFR's persistently <60 mL/min signify possible Chronic Kidney Disease. 11/12/2009      REVIEW OF SYSTEMS  DATA OBTAINED:  from patient, nurse, medical record,  GENERAL: Feels "fantastic" , ambulating with staff in hall, eating extremely well per staff a lot of carbs and friends take pt out to eat frequently SKIN: No itch, rash or open wounds   MOUTH/THROAT: No mouth or tooth pain No sore throat No difficulty chewing or swallowing  RESPIRATORY: no dyspnea, cough, wheezing CARDIAC: No chest pain, palpitations  GI: No abdominal pain No N/V/D or constipation No heartburn or reflux  GU: No dysuria, frequency or urgency No change in urine volume or character, frequent incontinence, only occasional bowel incontinence <1 month  MUSCULOSKELETAL: ambulating well with walker, staff is focusing on encouraging her to use the walker.recent fall 3/1, unsteady gait without walker, has increased LLE edemax1d, No joint pain or stiffness No back pain No muscle ache, pain, weakness  NEUROLOGIC: No dizziness, fainting, headache No change in mental status (dementia).  PSYCHIATRIC:No increased agitation/anxiety for 2 weeks. Has been cooperative. Sleeps well.    PHYSICAL EXAM Filed Vitals:   07/28/13 1206  BP: 110/62  Pulse: 75  Temp: 96.8 F (36 C)  Resp: 16  Weight: 155 lb (70.308 kg)  SpO2: 98%   Body mass index is 26.59 kg/(m^2).  GENERAL APPEARANCE: No acute distress, appropriately groomed, mildly overweight body habitus. Has gained 6 lbs in 2 months. Alert, pleasant, conversant. Ambulating with walker in hallway with staff Gross word finding difficulty  SKIN: No diaphoresis  HEAD: Normocephalic, atraumatic  EYES: Conjunctiva/lids clear. Pupils round, reactive.   NOSE: No deformity or discharge.  MOUTH/THROAT: Lips w/o lesions. Oral mucosa, tongue moist, w/o lesion.  RESPIRATORY: No sob with ambulation in hallway. Lung sounds are clear and full.  CARDIOVASCULAR: Heart RRR. No murmur or extra heart sounds.  EDEMA: bilateral LE edema left (1+) greater than right (trace), nontender, no abnormal coloring. 2+ pedal pulses  bilaterally  GASTROINTESTINAL: Abdomen is soft, non-tender. 5 lb weight gain in 2 months. Eating well  MUSCULOSKELETAL: Moves all extremities with full ROM, strength and tone. Back is without kyphosis, scoliosis or spinal process tenderness.steady gait, no balance deficit while ambulating with walker. Able to follow staff cues for safety NEUROLOGIC: Not oriented to time, place. Speech clear, language impaired. No tremor.  PSYCHIATRIC: very pleasant and cooperative during assessment     ASSESSMENT/PLAN  Dementia with behavioral disturbance Pt continues to cycle through 3-4days of increased agitation/combativeness/crying every 2-3 weeks. Request staff to document specific events/activity surrounding episodes of increased agitation. Will follow  Edema Intermittent LE edema not associated with functional decline/sob. Staff to continue to encourage LE elevation when seated and apply stockings daily.  Gait disturbance Gait has been steady with use of walker. A fall on 07/12/13 without injury. No witness to this event. Staff to continue close monitoring of ambulation and use of walker  SOB (shortness of breath) SOB resolved since last visit. Pt has been more physically active in the last 2 months with increased ambulation and participation in unit activities. Chest xray 06/09/13 was unremarkable. Previous SOB likely caused by deconditioning. Continue supervised activity and use of walker  Alzheimer's disease Functionally improved since last visit 05/30/13. Is ambulating and participating in activities more effectively.Assitance with  dressing has stabilized. Is eating extremely well. Has had 2 weeks without significant agitation/anxiety. Continues to have cycles of more agitated periods. Will follow these events more closely to see if pattern can be identified. Bladder incontinence frequent, unchanged     Follow up: Routine and as needed  Kinsleigh Ludolph T.Kahlel Peake, NP-C/Stephanie Mancel Parsons  student [facility] [phone]  07/28/2013

## 2013-07-28 NOTE — Assessment & Plan Note (Signed)
SOB resolved since last visit. Pt has been more physically active in the last 2 months with increased ambulation and participation in unit activities. Chest xray 06/09/13 was unremarkable. Previous SOB likely caused by deconditioning. Continue supervised activity and use of walker

## 2013-07-28 NOTE — Assessment & Plan Note (Addendum)
Intermittent LE edema not associated with functional decline/sob. Staff to continue to encourage LE elevation when seated and apply stockings daily.

## 2013-07-28 NOTE — Assessment & Plan Note (Signed)
Pt continues to cycle through 3-4days of increased agitation/combativeness/crying every 2-3 weeks. Request staff to document specific events/activity surrounding episodes of increased agitation. Will follow

## 2013-07-28 NOTE — Assessment & Plan Note (Signed)
Gait has been steady with use of walker. A fall on 07/12/13 without injury. No witness to this event. Staff to continue close monitoring of ambulation and use of walker

## 2013-07-28 NOTE — Assessment & Plan Note (Signed)
Functionally improved since last visit 05/30/13. Is ambulating and participating in activities more effectively.Assitance with dressing has stabilized. Is eating extremely well. Has had 2 weeks without significant agitation/anxiety. Continues to have cycles of more agitated periods. Will follow these events more closely to see if pattern can be identified. Bladder incontinence frequent, unchanged

## 2013-08-25 ENCOUNTER — Non-Acute Institutional Stay: Payer: Medicare Other | Admitting: Geriatric Medicine

## 2013-08-25 ENCOUNTER — Encounter: Payer: Self-pay | Admitting: Geriatric Medicine

## 2013-08-25 DIAGNOSIS — G309 Alzheimer's disease, unspecified: Principal | ICD-10-CM

## 2013-08-25 DIAGNOSIS — F03918 Unspecified dementia, unspecified severity, with other behavioral disturbance: Secondary | ICD-10-CM

## 2013-08-25 DIAGNOSIS — F0280 Dementia in other diseases classified elsewhere without behavioral disturbance: Secondary | ICD-10-CM

## 2013-08-25 DIAGNOSIS — F0391 Unspecified dementia with behavioral disturbance: Secondary | ICD-10-CM

## 2013-08-25 NOTE — Progress Notes (Signed)
Patient ID: Sharon Harper, female   DOB: [birth year], 73 y.o.   MRN: [id number]   [facility]  Code Status: DNR  HPI:  This is a 73 y.o. female resident of [facility], Memory Care section evaluated today in follow up of AD, dementia w/behavioral disturbance    Last Visit  Dementia with behavioral disturbance Pt continues to cycle through 3-4days of increased agitation/combativeness/crying every 2-3 weeks. Request staff to document specific events/activity surrounding episodes of increased agitation. Will follow  Gait disturbance Gait has been steady with use of walker. A fall on 07/12/13 without injury. No witness to this event. Staff to continue close monitoring of ambulation and use of walker  Alzheimer's disease Functionally improved since last visit 05/30/13. Is ambulating and participating in activities more effectively.Assitance with dressing has stabilized. Is eating extremely well. Has had 2 weeks without significant agitation/anxiety. Continues to have cycles of more agitated periods. Will follow these events more closely to see if pattern can be identified. Bladder incontinence frequent, unchanged   Since last visit patient has continued with labile mood, often very friendly cooperative and quickly changes to aggressive outburst. Reviewing nurses notes these episodes appear to be short lived and occur most often after dinner. Patient has increasing urinary incontinence, sometimes patient freely allows with management. Patient's gait remains very unsteady, she sometimes forgets to use her walker. There's been no reported falls in the last few weeks. Last week the care plan was held with patient's family and nurses and social work Haematologiststaff. Daughter is requesting medications be decreased and discontinued, specifically mirtazapine and Depakote. Telephone call with patient's daughter she expresses concern with too much variance in medication  management, prefers to have one provider managing medications. It has been dosed was tapered and discontinued between March 24 and April 6.   review facility record shows patient continues to gain weight, vital signs remained stable. P.o. intake remains very good, usually more than 50% each meal.   This morning patient is observed sleeping through music activity, awakens near the end, alert and interactive   No Known Allergies   DATA REVIEWED  Radiologic Exams:    Quality mobile x-ray  Chest xray 06/09/13: mild basilar atelectasis; no pulmonary vascular congestion, effusion, consolidation, infiltrates    Laboratory Studies: Lab Results  Component Value Date   WBC 5.7 04/17/2013   HGB 12.7 04/17/2013   HCT 37 04/17/2013   PLT 298 04/17/2013   Lab Results  Component Value Date   NA 140 04/17/2013   K 3.8 04/17/2013   GLU 98 04/17/2013   BUN 12 04/17/2013   CREATININE 0.8 04/17/2013   Lab Results  Component Value Date   AST 23 04/17/2013   ALT 15 04/17/2013   ALKPHOS 93 04/17/2013      REVIEW OF SYSTEMS  DATA OBTAINED: from patient, nurse, medical record,  GENERAL: Feels "fantastic".  No recent fever, fatigue, change in appetite. 8 pound weight gain in the last month  SKIN: No itch, rash or open wounds   MOUTH/THROAT: No mouth or tooth pain No sore throat   No difficulty chewing or swallowing  RESPIRATORY: No dyspnea, cough, wheezing CARDIAC: No chest pain, palpitations  GI: No abdominal pain No N/V/D or constipation No heartburn or reflux  occasional bowel incontinence  GU: No dysuria No change in urine volume or character, frequent incontinence MUSCULOSKELETAL: No c/o joint pain or stiffness No back pain No muscle ache, pain, weakness. Gait is unsteady NEUROLOGIC: No  dizziness, fainting, headache No change in mental status (dementia).  PSYCHIATRIC: Intermittent periods of agitated behavior. Sleeps well.   PHYSICAL EXAM Filed Vitals:   08/25/13 1047  BP: 111/68  Pulse: 65    Temp: 97.3 F (36.3 C)  Resp: 16  Weight: 163 lb (73.936 kg)  SpO2: 98%   Body mass index is 27.97 kg/(m^2).  GENERAL APPEARANCE: No acute distress, appropriately groomed, mildly overweight body habitus. Has gained 8 lbs in last months. Alert, pleasant, conversant.  Significant language deficit , answer one-word questions appropriately  SKIN: No diaphoresis , rash HEAD: Normocephalic, atraumatic  EYES: Conjunctiva/lids clear. Pupils round, reactive.   NOSE: No deformity or discharge.  MOUTH/THROAT: Lips w/o lesions. Oral mucosa, tongue moist, w/o lesion. Oropharynx w/o redness or lesions.  RESPIRATORY:  Breathing is even, unlabored at rest and with ambulation. Lung sounds are clear and full.  CARDIOVASCULAR: Heart RRR. No murmur or extra heart sounds.  EDEMA: Trace bilateral LE edema, compression hose in place  GASTROINTESTINAL: Abdomen is soft, non-tender, not distended w/ normal bowel sounds. MUSCULOSKELETAL: Moves all extremities with full ROM, strength and tone. Back is without kyphosis, scoliosis or spinal process tenderness.  Unsteady gait, better with walker.  NEUROLOGIC: Not oriented to time, place. Speech clear, language significantly impaired; nonsensical. No tremor.  PSYCHIATRIC: very pleasant and cooperative during assessment    ASSESSMENT/PLAN  Alzheimer's disease Functional status regarding incontinence continues to decline. He also remains quite impaired, patient is able to manage with the walker. Patient's daughter has repeated she wishes to have her Mom as comfortable and safe as possible. She would like as few medications as necessary.  Dementia with behavioral disturbance Depakote and sertraline restarted by Dr. Donell BeersPlovsky for mood stabilization. Since his medications at it patient has continued to gain weight, her gait has become more unsteady. Unclear if vacations are impacting these processes. Daughter wishes medications to be minimized, will start with Depakote,  taper and discontinue over the next 2 weeks.     Follow up: Routine and as needed  Toribio HarbourClaudette T.Jesson Foskey, NP-C [facility] [phone]  08/25/2013

## 2013-08-25 NOTE — Assessment & Plan Note (Signed)
Depakote and sertraline restarted by Dr. Donell BeersPlovsky for mood stabilization. Since his medications at it patient has continued to gain weight, her gait has become more unsteady. Unclear if vacations are impacting these processes. Daughter wishes medications to be minimized, will start with Depakote, taper and discontinue over the next 2 weeks.

## 2013-08-25 NOTE — Assessment & Plan Note (Signed)
Functional status regarding incontinence continues to decline. He also remains quite impaired, patient is able to manage with the walker. Patient's daughter has repeated she wishes to have her Mom as comfortable and safe as possible. She would like as few medications as necessary.

## 2013-10-09 ENCOUNTER — Encounter: Payer: Self-pay | Admitting: Geriatric Medicine

## 2013-10-09 ENCOUNTER — Non-Acute Institutional Stay: Payer: Medicare Other | Admitting: Geriatric Medicine

## 2013-10-09 DIAGNOSIS — S62101A Fracture of unspecified carpal bone, right wrist, initial encounter for closed fracture: Secondary | ICD-10-CM

## 2013-10-09 DIAGNOSIS — S62109A Fracture of unspecified carpal bone, unspecified wrist, initial encounter for closed fracture: Secondary | ICD-10-CM

## 2013-10-09 HISTORY — DX: Fracture of unspecified carpal bone, right wrist, initial encounter for closed fracture: S62.101A

## 2013-10-09 NOTE — Progress Notes (Signed)
Patient ID: Sharon Harper, female   DOB: Jan 04, 1941, 73 y.o.   MRN: [id number]   [facility] (13)  Code Status: DNR  Contact Information   Name Relation Home Work Mobile   Sharon, Harper  [phone]         Chief Complaint  Patient presents with  . Wrist Injury    HPI: This is a 73 y.o. female resident of [facility], Memory Care section.  Evaluation is requested today due to right wrist fracture.  The patient had a fall yesterday afternoon with immediate pain and swelling to her right wrist. X-ray was requested and returned with fracture involving the distal radius and ulna. Hand wrist and forearm have been immobilized with an Ace bandage ice application provided overnight.    No Known Allergies  MEDICATIONS -     Medication List       This list is accurate as of: 10/09/13 10:26 AM.  Always use your most recent med list.               acetaminophen 325 MG tablet  Commonly known as:  TYLENOL  Take 650 mg by mouth every 6 (six) hours as needed for moderate pain.     aspirin 81 MG tablet  Take 81 mg by mouth daily. Take one daily to prevent stroke, and heart attack, blood clot.     divalproex 250 MG 24 hr tablet  Commonly known as:  DEPAKOTE ER  Take 250 mg by mouth every morning.     docusate sodium 100 MG capsule  Commonly known as:  COLACE  Take 100 mg by mouth as directed. Take 2 tablets once daily at bedtime for stool softener.     donepezil 10 MG tablet  Commonly known as:  ARICEPT  Take 10 mg by mouth at bedtime. Take 1 daily for memory.     Fluticasone-Salmeterol 100-50 MCG/DOSE Aepb  Commonly known as:  ADVAIR  Inhale 1 puff into the lungs every 12 (twelve) hours. Take 1 puff twice daily.     lactose free nutrition Liqd  Take 237 mLs by mouth. May have one boost once daily as needed     LORazepam 0.5 MG tablet  Commonly known as:  ATIVAN  Take 0.5 mg by mouth every 8 (eight) hours. Take one tablet every 8  hours as needed for anxiety/agitation.     metoprolol succinate 25 MG 24 hr tablet  Commonly known as:  TOPROL-XL  Take 25 mg by mouth daily. Take 1 daily for blood pressure.     multivitamin tablet  Take 1 tablet by mouth daily. Take 1 tablet daily for vitamin supplement.     NAMENDA XR 28 MG Cp24  Generic drug:  Memantine HCl ER  Take by mouth every morning. Take 1 daily to help maintain functional status.     ranitidine 75 MG tablet  Commonly known as:  ZANTAC  Take 75 mg by mouth as directed. Take 1 tablet daily for GERD.     sertraline 100 MG tablet  Commonly known as:  ZOLOFT  Take 100 mg by mouth daily.         DATA REVIEWED  Radiologic Exams:   Quality mobile x-ray 10/08/2013  Right wrist, 3 views: Acute minimally displaced fractures are present involving the distal radius and ulna. Moderate degenerative changes right wrist Right hand, 3 views: No acute bony abnormalities right hand can be identified  Laboratory Studies: Lab Results  Component Value Date  WBC 5.7 04/17/2013   HGB 12.7 04/17/2013   HCT 37 04/17/2013   MCV 98.6 11/12/2009   PLT 298 04/17/2013   Lab Results  Component Value Date   NA 140 04/17/2013   K 3.8 04/17/2013   GLU 98 04/17/2013   BUN 12 04/17/2013   CREATININE 0.8 04/17/2013    Past Medical History  Diagnosis Date  . Unspecified constipation 06/24/2012  . Loss of weight 06/24/2012  . Dyspepsia and other specified disorders of function of stomach 06/04/2011  . Osteoarthrosis, unspecified whether generalized or localized, unspecified site 06/04/2011  . Unspecified paranoid state 03/14/2011  . Alzheimer's disease 11/13/2010  . Mild cognitive impairment, so stated 10/31/2010  . Senile cataract, unspecified 10/31/2010  . Coronary atherosclerosis of native coronary artery 10/31/2010  . Bunion 10/31/2010  . Abnormal weight gain 10/25/2010  . Other and unspecified hyperlipidemia 10/24/2010  . Iron deficiency anemia, unspecified 10/24/2010    . Depressive disorder, not elsewhere classified 10/24/2010  . Unspecified essential hypertension 10/24/2010  . Chronic airway obstruction, not elsewhere classified 10/24/2010   Past Surgical History  Procedure Laterality Date  . Cosmetic surgery  2000-2005    multiple surgeries  . Coronary artery bypass graft  2005  . Carpal tunnel release  2007  . Breast surgery  2007    Breast reduction  . Bunionectomy Bilateral 2007   History   Social History Narrative   Divorced. Retired, Research officer, political party Patient lives in memory Care section at [facility] since. Patient has Advanced planning documents:  Living Will, DNR    Stopped smoking 1983, minimal alcohol history          REVIEW OF SYSTEMS  DATA OBTAINED: from patient, nurse, medical record GENERAL: Feels "OK, my wrist hurts"   No recent fever, fatigue, change in activity status, appetite, or weight  RESPIRATORY: No cough, wheezing, SOB CARDIAC: No chest pain, palpitations. No edema GI: No abdominal pain  No Nausea,vomiting,diarrhea or constipation  No heartburn or reflux  MUSCULOSKELETAL: Rt. Wrist pain, swelling   No back pain    No muscle ache, pain, weakness    Gait is unsteady    Fall 10/08/2013  NEUROLOGIC: No dizziness, fainting, headache No change in mental status (mod-severe dementia) PSYCHIATRIC: No sign of increased anxiety,   Sleeps well   No behavior issue   PHYSICAL EXAM Filed Vitals:   10/09/13 1024  BP: 130/69  Pulse: 68  Temp: 98.2 F (36.8 C)  Resp: 15  SpO2: 97%   There is no weight on file to calculate BMI.   GENERAL APPEARANCE: No acute distress, appropriately groomed, normal body habitus. Alert, pleasant, less conversant than usual. SKIN: No diaphoresis, rash, HEAD: Normocephalic, atraumatic EYES: Conjunctiva/lids clear.  EARS:  Hearing grossly normal. NOSE: No deformity or discharge. RESPIRATORY: Breathing is even, unlabored. Lung sounds are clear and full.  CARDIOVASCULAR: Heart  RRR. MUSCULOSKELETAL: Right hand is edematous, wrist acutely tender , there is some ecchymosis of the right hand as well. Rest pain with any movement of her fingers . Ace bandage to wrist replaced after exam NEUROLOGIC: Not Oriented to time, place (usual status). Speech clear, mostly incoherent. Single word answers are appropriate.  PSYCHIATRIC: Mood and affect subdued   ASSESSMENT/PLAN  Wrist fracture, right 73 year old female with advanced dementia posture balance and fell yesterday injuring her right wrist. X-ray shows minimally displaced fractures of the distal radius and ulna. Recommend orthopedic evaluation. Discussed findings and plan with the patient's daughter she agrees.  Family/ staff Communication:  Discussed findings and plan with patient's daughter and caregiver via the telephone. In agreement, caregiver Eber JonesCarolyn will accompany patient to orthopedic visit today   Labs/tests ordered:    Follow up: Return for As needed.  Toribio Harbourlaudette T.Breelynn Bankert, NP-C [facility] [phone]  10/09/2013

## 2013-10-09 NOTE — Assessment & Plan Note (Signed)
73 year old female with advanced dementia posture balance and fell yesterday injuring her right wrist. X-ray shows minimally displaced fractures of the distal radius and ulna. Recommend orthopedic evaluation. Discussed findings and plan with the patient's daughter she agrees.

## 2013-12-01 ENCOUNTER — Non-Acute Institutional Stay: Payer: Medicare Other | Admitting: Nurse Practitioner

## 2013-12-01 DIAGNOSIS — K5900 Constipation, unspecified: Secondary | ICD-10-CM

## 2013-12-01 DIAGNOSIS — G309 Alzheimer's disease, unspecified: Principal | ICD-10-CM

## 2013-12-01 DIAGNOSIS — R634 Abnormal weight loss: Secondary | ICD-10-CM

## 2013-12-01 DIAGNOSIS — IMO0001 Reserved for inherently not codable concepts without codable children: Secondary | ICD-10-CM

## 2013-12-01 DIAGNOSIS — F329 Major depressive disorder, single episode, unspecified: Secondary | ICD-10-CM

## 2013-12-01 DIAGNOSIS — F0280 Dementia in other diseases classified elsewhere without behavioral disturbance: Secondary | ICD-10-CM

## 2013-12-01 DIAGNOSIS — S62101D Fracture of unspecified carpal bone, right wrist, subsequent encounter for fracture with routine healing: Secondary | ICD-10-CM

## 2013-12-01 DIAGNOSIS — F3289 Other specified depressive episodes: Secondary | ICD-10-CM

## 2013-12-01 NOTE — Progress Notes (Signed)
Patient ID: Sharon Harper, female   DOB: 1941-04-12, 73 y.o.   MRN: [id number]   [facility] (13)  Code Status: DNR      Contact Information   Name Relation Home Work Mobile   Sharon Harper  [phone]         Chief Complaint  Patient presents with  . Medical Management of Chronic Issues    HPI: This is a 73 y.o. female resident of [facility], Memory Care section that is assisted living patient who is being seen today for routine follow up on chronic conditions.   Recently pt with fracture involving the distal radius and ulna. Hand wrist and forearm in brace, working with therapy for ROM at this time, does not report pain but will have episodes of crying. Family also reporting more crying episodes  depakote was not tolerated, nursing reports pt with decrease in function once this was started. Now has been stopped without much change in function. Using WC and requiring increased assistance.    No Known Allergies  MEDICATIONS -     Medication List       This list is accurate as of: 12/01/13  2:51 PM.  Always use your most recent med list.               acetaminophen 325 MG tablet  Commonly known as:  TYLENOL  Take 650 mg by mouth every 6 (six) hours as needed for moderate pain.     aspirin 81 MG tablet  Take 81 mg by mouth daily. Take one daily to prevent stroke, and heart attack, blood clot.     docusate sodium 100 MG capsule  Commonly known as:  COLACE  Take 100 mg by mouth as directed. Take 2 tablets once daily at bedtime for stool softener.     donepezil 10 MG tablet  Commonly known as:  ARICEPT  Take 10 mg by mouth at bedtime. Take 1 daily for memory.     Fluticasone-Salmeterol 100-50 MCG/DOSE Aepb  Commonly known as:  ADVAIR  Inhale 1 puff into the lungs every 12 (twelve) hours. Take 1 puff twice daily.     lactose free nutrition Liqd  Take 237 mLs by mouth. May have one boost once daily as needed     LORazepam 0.5 MG tablet  Commonly known as:  ATIVAN  Take 0.5 mg by mouth every 8 (eight) hours. Take one tablet every 8 hours as needed for anxiety/agitation.     metoprolol succinate 25 MG 24 hr tablet  Commonly known as:  TOPROL-XL  Take 25 mg by mouth daily. Take 1 daily for blood pressure.     multivitamin tablet  Take 1 tablet by mouth daily. Take 1 tablet daily for vitamin supplement.     NAMENDA XR 28 MG Cp24  Generic drug:  Memantine HCl ER  Take by mouth every morning. Take 1 daily to help maintain functional status.     ranitidine 75 MG tablet  Commonly known as:  ZANTAC  Take 75 mg by mouth as directed. Take 1 tablet daily for GERD.     sertraline 100 MG tablet  Commonly known as:  ZOLOFT  Take 100 mg by mouth daily.         DATA REVIEWED  Radiologic Exams:   Quality mobile x-ray 10/08/2013  Right wrist, 3 views: Acute minimally displaced fractures are present involving the distal radius and ulna. Moderate degenerative changes right wrist Right hand, 3 views: No acute  bony abnormalities right hand can be identified  Laboratory Studies: Lab Results  Component Value Date   WBC 5.7 04/17/2013   HGB 12.7 04/17/2013   HCT 37 04/17/2013   MCV 98.6 11/12/2009   PLT 298 04/17/2013   Lab Results  Component Value Date   NA 140 04/17/2013   K 3.8 04/17/2013   GLU 98 04/17/2013   BUN 12 04/17/2013   CREATININE 0.8 04/17/2013    Past Medical History  Diagnosis Date  . Unspecified constipation 06/24/2012  . Loss of weight 06/24/2012  . Dyspepsia and other specified disorders of function of stomach 06/04/2011  . Osteoarthrosis, unspecified whether generalized or localized, unspecified site 06/04/2011  . Unspecified paranoid state 03/14/2011  . Alzheimer's disease 11/13/2010  . Mild cognitive impairment, so stated 10/31/2010  . Senile cataract, unspecified 10/31/2010  . Coronary atherosclerosis of native coronary artery 10/31/2010  . Bunion 10/31/2010  . Abnormal  weight gain 10/25/2010  . Other and unspecified hyperlipidemia 10/24/2010  . Iron deficiency anemia, unspecified 10/24/2010  . Depressive disorder, not elsewhere classified 10/24/2010  . Unspecified essential hypertension 10/24/2010  . Chronic airway obstruction, not elsewhere classified 10/24/2010  . Wrist fracture, right 10/09/2013   Past Surgical History  Procedure Laterality Date  . Cosmetic surgery  2000-2005    multiple surgeries  . Coronary artery bypass graft  2005  . Carpal tunnel release  2007  . Breast surgery  2007    Breast reduction  . Bunionectomy Bilateral 2007   History   Social History Narrative   Divorced. Retired, Research officer, political partyeal Estate [facility] Patient has Advanced planning documents:  Living Will, DNR    Stopped smoking 1983, minimal alcohol history          REVIEW OF SYSTEMS  DATA OBTAINED: from patient, nurse, medical record GENERAL:No recent fever, fatigue, change in activity status, eating less/ decrease appetite, slow progressive weight loss RESPIRATORY: No cough, wheezing, SOB CARDIAC: No chest pain, palpitations. No edema GI: No abdominal pain  No Nausea,vomiting,diarrhea or constipation  No heartburn or reflux  MUSCULOSKELETAL: No back pain    No muscle ache, pain, weakness    Gait is unsteady using WC NEUROLOGIC: No dizziness, fainting, headache No change in mental status (mod-severe dementia) PSYCHIATRIC: No sign of increased anxiety,   Sleeps well   No behavior issue   PHYSICAL EXAM Filed Vitals:   12/01/13 1447  BP: 114/76  Pulse: 65  Temp: 97.5 F (36.4 C)  Resp: 20  Weight: 153 lb (69.4 kg)   Body mass index is 26.25 kg/(m^2).   GENERAL APPEARANCE: No acute distress, appropriately groomed, normal body habitus.  SKIN: No diaphoresis, rash, HEAD: Normocephalic, atraumatic EYES: Conjunctiva/lids clear.  EARS:  Hearing grossly normal. NOSE: No deformity or  discharge. RESPIRATORY: Breathing is even, unlabored. Lung sounds CTA CARDIOVASCULAR: Heart RRR. MUSCULOSKELETAL: normal exam, limited ROM to right wrist however does not complain of pain NEUROLOGIC: Not Oriented to time, place (usual status). Speech clear, mostly incoherent. Single word answers are appropriate.  PSYCHIATRIC: flat affect    ASSESSMENT/PLAN   1. Alzheimer's disease conts to decline, requiring more care over the last few months  2. Unspecified constipation Well controlled on current regimen   3. Wrist fracture, right, with routine healing, subsequent encounter conts OT, does not report pain  4. Depressive disorder, not elsewhere classified Increase in  crying episodes did not tolerate Depakote,  Will increase zoloft to 150 mg at this  time to see if this helps with mood   5. Loss of weight Weight has trended down over the last few months, staff trying to assist with meals but pt does not wish to have help, apparently pt with weight gain Will cont to monitor.    Will update labs

## 2013-12-03 LAB — CBC AND DIFFERENTIAL
HEMATOCRIT: 36 % (ref 36–46)
HEMOGLOBIN: 12.5 g/dL (ref 12.0–16.0)
Platelets: 267 10*3/uL (ref 150–399)
WBC: 6.4 10*3/mL

## 2013-12-03 LAB — BASIC METABOLIC PANEL
BUN: 13 mg/dL (ref 4–21)
CREATININE: 0.7 mg/dL (ref 0.5–1.1)
Glucose: 88 mg/dL
Potassium: 3.9 mmol/L (ref 3.4–5.3)
SODIUM: 141 mmol/L (ref 137–147)

## 2014-01-29 ENCOUNTER — Encounter: Payer: Self-pay | Admitting: Internal Medicine

## 2014-03-30 ENCOUNTER — Encounter: Payer: Self-pay | Admitting: Adult Health

## 2014-03-30 ENCOUNTER — Non-Acute Institutional Stay: Payer: Medicare Other | Admitting: Adult Health

## 2014-03-30 DIAGNOSIS — F329 Major depressive disorder, single episode, unspecified: Secondary | ICD-10-CM

## 2014-03-30 DIAGNOSIS — F0391 Unspecified dementia with behavioral disturbance: Secondary | ICD-10-CM

## 2014-03-30 DIAGNOSIS — F32A Depression, unspecified: Secondary | ICD-10-CM

## 2014-03-30 DIAGNOSIS — F03918 Unspecified dementia, unspecified severity, with other behavioral disturbance: Secondary | ICD-10-CM

## 2014-03-30 DIAGNOSIS — I2510 Atherosclerotic heart disease of native coronary artery without angina pectoris: Secondary | ICD-10-CM

## 2014-03-30 NOTE — Progress Notes (Signed)
Patient ID: Sharon Harper, female   DOB: 1941-01-13, 73 y.o.   MRN: [id number]  Nursing Home Location[facility]  [facility]   Place of Service: ALF [phone]  Chief Complaint  Patient presents with  . Medical Management of Chronic Issues    HPI:  This is a 73 y.o. female [facility], [facility].  I am here to review her chronic medical issues. Her VS have been stable over the past month. Her weight has decreased by 4-5 lbs over the past two months. The nurse reports that she has periods of laughing and crying 2-3 times per day that are unrelated to an event. There are no other complaints from the resident or staff. The resident confabulates when conversing with her and can not answer q's. She is in a wheelchair for our visit.   Review of Systems:  Review of Systems  Unable to perform ROS: Dementia    Medications: Patient's Medications  New Prescriptions   No medications on file  Previous Medications   ACETAMINOPHEN (TYLENOL) 325 MG TABLET    Take 650 mg by mouth every 6 (six) hours as needed for moderate pain.   ASPIRIN 81 MG TABLET    Take 81 mg by mouth daily. Take one daily to prevent stroke, and heart attack, blood clot.   DOCUSATE SODIUM (COLACE) 100 MG CAPSULE    Take 100 mg by mouth as directed. Take 2 tablets once daily at bedtime for stool softener.   DONEPEZIL (ARICEPT) 10 MG TABLET    Take 10 mg by mouth at bedtime. Take 1 daily for memory.   FLUTICASONE-SALMETEROL (ADVAIR) 100-50 MCG/DOSE AEPB    Inhale 1 puff into the lungs every 12 (twelve) hours. Take 1 puff twice daily.   LORAZEPAM (ATIVAN) 0.5 MG TABLET    Take 0.5 mg by mouth every 8 (eight) hours. Take one tablet every 8 hours as needed for anxiety/agitation.   MEMANTINE HCL ER (NAMENDA XR) 28 MG CP24    Take by mouth every morning. Take 1 daily to help maintain functional status.   METOPROLOL SUCCINATE (TOPROL-XL) 25 MG 24 HR TABLET    Take 25 mg by mouth  daily. Take 1 daily for blood pressure.   MULTIPLE VITAMIN (MULTIVITAMIN) TABLET    Take 1 tablet by mouth daily. Take 1 tablet daily for vitamin supplement.   RANITIDINE (ZANTAC) 75 MG TABLET    Take 75 mg by mouth as directed. Take 1 tablet daily for GERD.   SERTRALINE (ZOLOFT) 100 MG TABLET    Take 50 mg by mouth daily. Give total 150mg  once a morning  Modified Medications   No medications on file  Discontinued Medications   No medications on file     Physical Exam:  Filed Vitals:   03/30/14 1238  BP: 114/63  Pulse: 79  Temp: 96.6 F (35.9 C)  Resp: 18  Weight: 148 lb 8 oz (67.359 kg)  SpO2: 98%    Physical Exam  Constitutional: She is well-developed, well-nourished, and in no distress. No distress.  HENT:  Head: Normocephalic.  Eyes: Pupils are equal, round, and reactive to light.  Neck: Neck supple. No JVD present. No thyromegaly present.  Cardiovascular: Normal rate, regular rhythm and normal heart sounds.   No murmur heard. Pulmonary/Chest: Effort normal and breath sounds normal. No respiratory distress.  Abdominal: Soft. Bowel sounds are normal. She exhibits no distension. There is no tenderness.  Musculoskeletal: She exhibits no edema.  Decreased  ROM to shoulders and hips. Strength 3/5 on the RUE, 4/5 on the LUE  Lymphadenopathy:    She has no cervical adenopathy.  Neurological: She is alert. No cranial nerve deficit.  Abnormal gait, uses wheelchair. Alert but not oriented, difficulty following commands, confabulates for questions  Skin: Skin is warm and dry. She is not diaphoretic.      Labs reviewed/Significant Diagnostic Results:  Basic Metabolic Panel:  Recent Labs  16/02/9611/05/14 12/03/13  NA 140 141  K 3.8 3.9  BUN 12 13  CREATININE 0.8 0.7   Liver Function Tests:  Recent Labs  04/17/13  AST 23  ALT 15  ALKPHOS 93   No results for input(s): LIPASE, AMYLASE in the last 8760 hours. No results for input(s): AMMONIA in the last 8760  hours. CBC:  Recent Labs  04/17/13 12/03/13  WBC 5.7 6.4  HGB 12.7 12.5  HCT 37 36  PLT 298 267   CBG: No results for input(s): GLUCAP in the last 8760 hours. TSH:  Recent Labs  04/17/13  TSH 3.24     Assessment/Plan Dementia with behavioral disturbance She is able to feed herself but needs assistance with most of ADLs. Currrently on Namenda and Aricept and tolerating well. Continue current meds as she may still be receiving benefit.   Depression Continue Zoloft due to hx of depression and reported crying episodes by staff. These episodes may however represent PBA. If these episodes increase in # and begin to interfere with her ADLs would consider Nuedexta.  Coronary atherosclerosis of native coronary artery Currently on a BB and ASA and tolerating well. No c/o CP, palpitations, or SOB. Continue current meds and monitor.   I can't not find documentation to support the use of Advair and I am not sure can use proper technique. Consider discontinuing at the next visit.   Sharon Harper, ANP [facility] [phone]

## 2014-04-01 NOTE — Assessment & Plan Note (Signed)
Currently on a BB and ASA and tolerating well. No c/o CP, palpitations, or SOB. Continue current meds and monitor.

## 2014-04-01 NOTE — Assessment & Plan Note (Signed)
She is able to feed herself but needs assistance with most of ADLs. Currrently on Namenda and Aricept and tolerating well. Continue current meds as she may still be receiving benefit.

## 2014-04-01 NOTE — Assessment & Plan Note (Signed)
Continue Zoloft due to hx of depression and reported crying episodes by staff. These episodes may however represent PBA. If these episodes increase in # and begin to interfere with her ADLs would consider Nuedexta.

## 2014-04-12 ENCOUNTER — Non-Acute Institutional Stay: Payer: Medicare Other | Admitting: Adult Health

## 2014-04-12 DIAGNOSIS — F482 Pseudobulbar affect: Secondary | ICD-10-CM

## 2014-04-12 DIAGNOSIS — F0391 Unspecified dementia with behavioral disturbance: Secondary | ICD-10-CM

## 2014-04-12 DIAGNOSIS — I2510 Atherosclerotic heart disease of native coronary artery without angina pectoris: Secondary | ICD-10-CM

## 2014-04-12 DIAGNOSIS — F03918 Unspecified dementia, unspecified severity, with other behavioral disturbance: Secondary | ICD-10-CM

## 2014-04-12 NOTE — Assessment & Plan Note (Signed)
D/C metoprolol and ASA due to refusal to take. Her HR has been in the 50-60's. Will monitor HR for 1 week and try a short acting BB that can be crushed if her HR>95.  Given her advanced dementia, aggressive care is not warranted here.

## 2014-04-12 NOTE — Assessment & Plan Note (Signed)
Discontinue MVI, tylenol due to refusing to swallow. She most likely does not need these and the staff will monitor for increased pain. The will continue to try to get her to take her meds and provide supportive care. Continue Aricept and Namenda, may consider exelon patch instead.

## 2014-04-12 NOTE — Progress Notes (Signed)
Patient ID: Peggye Formatricia Pallone, female   DOB: 1940/08/14, 73 y.o.   MRN: [id number]  Nursing Home Location:  [facility]   Code Status: DNR   Place of Service: ALF (13)  Chief Complaint  Patient presents with  . Acute Visit    crying, not taking pills    HPI: 73 y.o.  [facility], [facility].  I was asked to see her by the staff today due to increased crying and laughing episodes. The staff also reports that she has been refusing to swallow whole pills. The resident's daughter has requested that we look at discontinuing meds that are not necessary. The nurse reports that they are intermittently able to get pills in that can be crushed and put in pudding.  Review of Systems:  Review of Systems  Unable to perform ROS: Dementia    Medications: Patient's Medications  New Prescriptions   No medications on file  Previous Medications   DOCUSATE SODIUM (COLACE) 100 MG CAPSULE    Take 100 mg by mouth as directed. Take 2 tablets once daily at bedtime for stool softener.   DONEPEZIL (ARICEPT) 10 MG TABLET    Take 10 mg by mouth at bedtime. Take 1 daily for memory.   LORAZEPAM (ATIVAN) 0.5 MG TABLET    Take 0.5 mg by mouth every 8 (eight) hours. Take one tablet every 8 hours as needed for anxiety/agitation.   MEMANTINE HCL ER (NAMENDA XR) 28 MG CP24    Take by mouth every morning. Take 1 daily to help maintain functional status.   RANITIDINE (ZANTAC) 75 MG TABLET    Take 75 mg by mouth as directed. Take 1 tablet daily for GERD.   SERTRALINE (ZOLOFT) 100 MG TABLET    Take 50 mg by mouth daily. Give total 150mg  once a morning  Modified Medications   No medications on file  Discontinued Medications   ACETAMINOPHEN (TYLENOL) 325 MG TABLET    Take 650 mg by mouth every 6 (six) hours as needed for moderate pain.   ASPIRIN 81 MG TABLET    Take 81 mg by mouth daily. Take one daily to prevent stroke, and heart attack, blood clot.     FLUTICASONE-SALMETEROL (ADVAIR) 100-50 MCG/DOSE AEPB    Inhale 1 puff into the lungs every 12 (twelve) hours. Take 1 puff twice daily.   METOPROLOL SUCCINATE (TOPROL-XL) 25 MG 24 HR TABLET    Take 25 mg by mouth daily. Take 1 daily for blood pressure.   MULTIPLE VITAMIN (MULTIVITAMIN) TABLET    Take 1 tablet by mouth daily. Take 1 tablet daily for vitamin supplement.     Physical Exam:  Filed Vitals:   04/12/14 1231  BP: 108/62  Pulse: 63  Temp: 97.6 F (36.4 C)  Resp: 18  SpO2: 95%    Physical Exam  Constitutional: She is well-developed, well-nourished, and in no distress. No distress.  HENT:  Head: Normocephalic.  Eyes: Pupils are equal, round, and reactive to light.  Neck: Neck supple. No JVD present. No thyromegaly present.  Cardiovascular: Normal rate, regular rhythm and normal heart sounds.   No murmur heard. Pulmonary/Chest: Effort normal and breath sounds normal. No respiratory distress.  Abdominal: Soft. Bowel sounds are normal. She exhibits no distension. There is no tenderness.  Musculoskeletal: She exhibits no edema.  Decreased ROM to shoulders and hips. Strength 3/5 on the RUE, 4/5 on the LUE  Lymphadenopathy:    She has no cervical adenopathy.  Neurological: She is alert. No cranial nerve deficit.  Abnormal gait, uses wheelchair. Alert but not oriented, difficulty following commands, confabulates for questions  Skin: Skin is warm and dry. She is not diaphoretic.  Psychiatric:  Cried during our visit    Labs reviewed/Significant Diagnostic Results:  Basic Metabolic Panel:  Recent Labs  04/54/910/09/25 12/03/13  NA 140 141  K 3.8 3.9  BUN 12 13  CREATININE 0.8 0.7   Liver Function Tests:  Recent Labs  04/17/13  AST 23  ALT 15  ALKPHOS 93   No results for input(s): LIPASE, AMYLASE in the last 8760 hours. No results for input(s): AMMONIA in the last 8760 hours. CBC:  Recent Labs  04/17/13 12/03/13  WBC 5.7 6.4  HGB 12.7 12.5  HCT 37 36  PLT 298  267   CBG: No results for input(s): GLUCAP in the last 8760 hours. TSH:  Recent Labs  04/17/13  TSH 3.24     Assessment/Plan  Dementia with behavioral disturbance Discontinue MVI, tylenol due to refusing to swallow. She most likely does not need these and the staff will monitor for increased pain. The will continue to try to get her to take her meds and provide supportive care. Continue Aricept and Namenda, may consider exelon patch instead.  Coronary atherosclerosis of native coronary artery D/C metoprolol and ASA due to refusal to take. Her HR has been in the 50-60's. Will monitor HR for 1 week and try a short acting BB that can be crushed if her HR>95.  Given her advanced dementia, aggressive care is not warranted here.   Pseudobulbar affect Her behaviors of crying and laughing are interfering with her well being and they do not seem to correlate with the situation. She would benefit from trying Nuedexta. I spoke with the POA and she is in agreemetn with this plan. We will monitor closely for interactions with this medication since its mechanism of action is to inhibit metabolism. We will try this for 4-6 eweks and if no improvement discontinue. She has no documented hx of prolonged QT     D/C advair due to improper technique. Unclear the reason this was started. Albuterol ordered q6hr prn sob or wheeze.  Peggye Leyhristy Suprina [place], ANP [facility] [phone]

## 2014-04-14 ENCOUNTER — Encounter: Payer: Self-pay | Admitting: Adult Health

## 2014-04-14 DIAGNOSIS — F482 Pseudobulbar affect: Secondary | ICD-10-CM | POA: Insufficient documentation

## 2014-04-14 NOTE — Assessment & Plan Note (Addendum)
Her behaviors of crying and laughing are interfering with her well being and they do not seem to correlate with the situation. She would benefit from trying Nuedexta. I spoke with the POA and she is in agreemetn with this plan. We will monitor closely for interactions with this medication since its mechanism of action is to inhibit metabolism. We will try this for 4-6 eweks and if no improvement discontinue. She has no documented hx of prolonged QT

## 2014-05-04 ENCOUNTER — Non-Acute Institutional Stay: Payer: Medicare Other | Admitting: Adult Health

## 2014-05-04 DIAGNOSIS — K219 Gastro-esophageal reflux disease without esophagitis: Secondary | ICD-10-CM

## 2014-05-04 DIAGNOSIS — F482 Pseudobulbar affect: Secondary | ICD-10-CM

## 2014-05-04 DIAGNOSIS — R05 Cough: Secondary | ICD-10-CM | POA: Insufficient documentation

## 2014-05-04 DIAGNOSIS — R059 Cough, unspecified: Secondary | ICD-10-CM | POA: Insufficient documentation

## 2014-05-04 NOTE — Progress Notes (Signed)
Patient ID: Sharon Harper, female   DOB: 1940/11/23, 73 y.o.   MRN: [id number]    Patient ID: Sharon Harper, female   DOB: 1940/11/23, 73 y.o.   MRN: [id number]  Nursing Home Location:  [facility]   Code Status: DNR   Place of Service: ALF (13)  Chief Complaint  Patient presents with  . Acute Visit    sputum production    HPI: 73 y.o.  [facility], [facility].  I was asked to see her today due to increased sputum production. She has been holding food in her mouth, as well as pills for the past two months. She was on Advair at the end of Nov but this was discontinued because she could not use it appropriately due to dementia. The sitter, Eber JonesCarolyn, reports that she is having yellow and/or white sputum. She is not coughing per her report but gets sputum up and clears her throat and is spitting more. She continues to hold food and pills in her mouth periodically. She has not had a runny nose, congestion, fever, DOE, CP or SOB.  Review of Systems:  Review of Systems  Unable to perform ROS: Dementia    Medications: Patient's Medications  New Prescriptions   No medications on file  Previous Medications   CETIRIZINE (ZYRTEC) 10 MG TABLET    Take 10 mg by mouth daily.   DEXTROMETHORPHAN-QUINIDINE (NUEDEXTA) 20-10 MG CAPS    Take 1 capsule by mouth 2 (two) times daily.   DOCUSATE SODIUM (COLACE) 100 MG CAPSULE    Take 100 mg by mouth as directed. Take 2 tablets once daily at bedtime for stool softener.   DONEPEZIL (ARICEPT) 10 MG TABLET    Take 10 mg by mouth at bedtime. Take 1 daily for memory.   ESOMEPRAZOLE (NEXIUM) 40 MG CAPSULE    Take 40 mg by mouth daily at 12 noon.   LORAZEPAM (ATIVAN) 0.5 MG TABLET    Take 0.5 mg by mouth every 8 (eight) hours. Take one tablet every 8 hours as needed for anxiety/agitation.   MEMANTINE HCL ER (NAMENDA XR) 28 MG CP24    Take by mouth every morning. Take 1 daily to help maintain  functional status.   SERTRALINE (ZOLOFT) 100 MG TABLET    Take 50 mg by mouth daily. Give total 150mg  once a morning  Modified Medications   No medications on file  Discontinued Medications   RANITIDINE (ZANTAC) 75 MG TABLET    Take 75 mg by mouth as directed. Take 1 tablet daily for GERD.     Physical Exam:  Filed Vitals:   05/04/14 1229  BP: 128/70  Pulse: 67  Temp: 97 F (36.1 C)  Resp: 18    Physical Exam  Constitutional: She is well-developed, well-nourished, and in no distress. No distress.  HENT:  Head: Normocephalic.  Eyes: Pupils are equal, round, and reactive to light.  Neck: Neck supple. No JVD present. No thyromegaly present.  Cardiovascular: Normal rate, regular rhythm and normal heart sounds.   No murmur heard. Pulmonary/Chest: Effort normal and breath sounds normal. No respiratory distress.  Abdominal: Soft. Bowel sounds are normal. She exhibits no distension. There is no tenderness.  Musculoskeletal: She exhibits no edema.  Decreased ROM to shoulders and hips. Strength 3/5 on the RUE, 4/5 on the LUE  Lymphadenopathy:    She has no cervical adenopathy.  Neurological: She is alert. No cranial nerve deficit.  Abnormal gait, uses wheelchair. Alert but  not oriented, difficulty following commands, confabulates for questions  Skin: Skin is warm and dry. She is not diaphoretic.  Psychiatric:  Confabulates, pleasant today    Labs reviewed/Significant Diagnostic Results:  Basic Metabolic Panel:  Recent Labs  56/21/3007/23/15  NA 141  K 3.9  BUN 13  CREATININE 0.7   Liver Function Tests: No results for input(s): AST, ALT, ALKPHOS, BILITOT, PROT, ALBUMIN in the last 8760 hours. No results for input(s): LIPASE, AMYLASE in the last 8760 hours. No results for input(s): AMMONIA in the last 8760 hours. CBC:  Recent Labs  12/03/13  WBC 6.4  HGB 12.5  HCT 36  PLT 267   CBG: No results for input(s): GLUCAP in the last 8760 hours. TSH: No results for input(s):  TSH in the last 8760 hours.   Assessment/Plan  Pseudobulbar affect Mild improvement in the number of episodes, however, she is walking less now and having some swallowing issues. She is holding food in her mouth and having sputum production. Will consider discontinuing if no further improvement.  Cough She is clearing her throat regularly with large amounts of yellow or white sputum. She has not had a fever. She is also holding food in her mouth at meals. She will continue to work with ST.  I have ordered zyrtec 10mg  qhs. See note under gerd.  GERD (gastroesophageal reflux disease) D/C Zantac due to questions of efficacy. Begin nexium 40mg  daily.    Sharon Leyhristy Graesyn Schreifels, ANP [facility] [phone]

## 2014-05-04 NOTE — Assessment & Plan Note (Signed)
D/C Zantac due to questions of efficacy. Begin nexium 40mg  daily.

## 2014-05-04 NOTE — Assessment & Plan Note (Signed)
She is clearing her throat regularly with large amounts of yellow or white sputum. She has not had a fever. She is also holding food in her mouth at meals. She will continue to work with ST.  I have ordered zyrtec 10mg  qhs. See note under gerd.

## 2014-05-04 NOTE — Assessment & Plan Note (Signed)
Mild improvement in the number of episodes, however, she is walking less now and having some swallowing issues. She is holding food in her mouth and having sputum production. Will consider discontinuing if no further improvement.

## 2014-05-21 LAB — CBC AND DIFFERENTIAL
HEMATOCRIT: 34 % — AB (ref 36–46)
Hemoglobin: 11.3 g/dL — AB (ref 12.0–16.0)
PLATELETS: 266 10*3/uL (ref 150–399)
WBC: 8.9 10*3/mL

## 2014-05-21 LAB — BASIC METABOLIC PANEL
BUN: 14 mg/dL (ref 4–21)
Creatinine: 0.8 mg/dL (ref 0.5–1.1)
GLUCOSE: 97 mg/dL
Potassium: 3.8 mmol/L (ref 3.4–5.3)
SODIUM: 143 mmol/L (ref 137–147)

## 2014-05-24 ENCOUNTER — Encounter: Payer: Self-pay | Admitting: Adult Health

## 2014-05-24 ENCOUNTER — Non-Acute Institutional Stay (SKILLED_NURSING_FACILITY): Payer: Medicare Other | Admitting: Adult Health

## 2014-05-24 DIAGNOSIS — F03918 Unspecified dementia, unspecified severity, with other behavioral disturbance: Secondary | ICD-10-CM

## 2014-05-24 DIAGNOSIS — R05 Cough: Secondary | ICD-10-CM

## 2014-05-24 DIAGNOSIS — K219 Gastro-esophageal reflux disease without esophagitis: Secondary | ICD-10-CM

## 2014-05-24 DIAGNOSIS — R531 Weakness: Secondary | ICD-10-CM | POA: Insufficient documentation

## 2014-05-24 DIAGNOSIS — F0391 Unspecified dementia with behavioral disturbance: Secondary | ICD-10-CM

## 2014-05-24 DIAGNOSIS — F482 Pseudobulbar affect: Secondary | ICD-10-CM

## 2014-05-24 DIAGNOSIS — R059 Cough, unspecified: Secondary | ICD-10-CM

## 2014-05-24 NOTE — Progress Notes (Signed)
Patient ID: Sharon Harper, female   DOB: 1940/07/25, 74 y.o.   MRN: [id number]  Nursing Home Location:  [facility]   Code Status: DNR   Place of Service: SNF (31)  Chief Complaint  Patient presents with  . Acute Visit    lethargy    HPI: 74 y.o.  [facility], [facility].  I was asked to see her today due to increased lethargy and decreased appetite with weakness. ON 05/21/14 she was wheezing and having trouble swallowing. A PCXR was ordered with no acute findings. A UA was ordered showing many bacteria, positive for nitrites. On call MD prescribed Rocephin for 3 days. She has received duonebs q8 hrs over the weekend for wheezing and this has helped. There is suspicion for aspiration and ST has been consulted. Staff has noted that she coughs with meals and pockets food. Today she is more alert and not wheezing but still not able to eat breakfast, weak, and neglecting her left side at times but able to move her left arm. She is afebrile with normal VS.   Review of Systems:  Review of Systems  Unable to perform ROS: Dementia    Medications: Patient's Medications  New Prescriptions   No medications on file  Previous Medications   ASPIRIN 81 MG TABLET    Take 81 mg by mouth daily.   DOCUSATE SODIUM (COLACE) 100 MG CAPSULE    Take 100 mg by mouth as directed. Take 2 tablets once daily at bedtime for stool softener.   DONEPEZIL (ARICEPT) 10 MG TABLET    Take 10 mg by mouth at bedtime. Take 1 daily for memory.   ESOMEPRAZOLE (NEXIUM) 40 MG CAPSULE    Take 40 mg by mouth daily at 12 noon.   IPRATROPIUM-ALBUTEROL (DUONEB) 0.5-2.5 (3) MG/3ML SOLN    Take 3 mLs by nebulization 2 times daily at 12 noon and 4 pm.   LORAZEPAM (ATIVAN) 0.5 MG TABLET    Take 0.5 mg by mouth every 8 (eight) hours. Take one tablet every 8 hours as needed for anxiety/agitation.   MEMANTINE HCL ER (NAMENDA XR) 28 MG CP24    Take by mouth every  morning. Take 1 daily to help maintain functional status.   SERTRALINE (ZOLOFT) 100 MG TABLET    Take 50 mg by mouth daily. Give total 150mg  once a morning  Modified Medications   No medications on file  Discontinued Medications   CETIRIZINE (ZYRTEC) 10 MG TABLET    Take 10 mg by mouth daily.   DEXTROMETHORPHAN-QUINIDINE (NUEDEXTA) 20-10 MG CAPS    Take 1 capsule by mouth 2 (two) times daily.     Physical Exam:  Filed Vitals:   05/24/14 1004  BP: 144/82  Pulse: 77  Temp: 97.7 F (36.5 C)  Resp: 20  SpO2: 96%    Physical Exam  Constitutional: She is well-developed, well-nourished, and in no distress. No distress.  HENT:  Head: Normocephalic.  Mouth/Throat: Oropharynx is clear and moist. No oropharyngeal exudate.  Eyes: Conjunctivae are normal. Pupils are equal, round, and reactive to light. Right eye exhibits no discharge. Left eye exhibits no discharge.  Neck: Neck supple. No JVD present. No thyromegaly present.  Cardiovascular: Normal rate, regular rhythm and normal heart sounds.   No murmur heard. Pulmonary/Chest: Effort normal and breath sounds normal. No respiratory distress.  Abdominal: Soft. Bowel sounds are normal. She exhibits no distension. There is no tenderness.  No cva or s/p  tenderness  Musculoskeletal: She exhibits no edema.  Decreased ROM to shoulders and hips. Strength 2/5 on the RUE, 3/5 on the LUE  Lymphadenopathy:    She has no cervical adenopathy.  Neurological: She is alert.  Abnormal gait, uses wheelchair. Alert but not oriented, difficulty following commands. Will not look to the left. Could not test CN due to dementia.  Skin: Skin is warm and dry. She is not diaphoretic.  Psychiatric:  Slightly lethargic    Labs reviewed/Significant Diagnostic Results:  Basic Metabolic Panel:  Recent Labs  16/10/96  NA 141  K 3.9  BUN 13  CREATININE 0.7   Liver Function Tests: No results for input(s): AST, ALT, ALKPHOS, BILITOT, PROT, ALBUMIN in the  last 8760 hours. No results for input(s): LIPASE, AMYLASE in the last 8760 hours. No results for input(s): AMMONIA in the last 8760 hours. CBC:  Recent Labs  12/03/13  WBC 6.4  HGB 12.5  HCT 36  PLT 267   CBG: No results for input(s): GLUCAP in the last 8760 hours. TSH: No results for input(s): TSH in the last 8760 hours.   Assessment/Plan  Urinary tract infection, site not specified Afebrile, lethargy some improved. Completed 3 days of Rocephin. Final C and S not returned. Recheck UA C and S for eradication due to continued weakness, lethargy, and decreased appetite.  Cough Has a hx of smoking but no PFTs for review. Was on Advair but was discontinued due to inability to use. Currently on Duonebs q 8 hrs. No wheezing but some coughing with meals still noted. Will make duonebs BID.  ST to eval and tx for dysphagia.  Pseudobulbar affect Nuedexta discontinued. Family is concerned for lethargy with this med, although the med was stopped 5 days ago and she remains some what lethargic.   Dementia with behavioral disturbance Overall decline with progressive weakness and trouble swallowing. Goals of care are comfort based, per Ivy Lynn, her daughter. She would still like treatment for infections/other illlnesses at this time.  GERD (gastroesophageal reflux disease) Continue Nexium.   Weakness She continues to exhibit weakness and lack of appetite. See above. PT and OT to eval and tx. She seems to be neglecting the left side. Diff Dx ?CVA. Her daughter does not want aggressive work up. It is difficult to assess because she is not able to follow exam for a full neuro exam.  Will add ASA 81 mg for prevention of further CVA  I spent >50% of this visit in counseling and consultation with the POA, Leighsa, and the staff.   Sharon Ley, ANP [facility] [phone]

## 2014-05-24 NOTE — Assessment & Plan Note (Signed)
She continues to exhibit weakness and lack of appetite. See above. PT and OT to eval and tx. She seems to be neglecting the left side. Diff Dx ?CVA. Her daughter does not want aggressive work up. It is difficult to assess because she is not able to follow exam for a full neuro exam.  Will add ASA 81 mg for prevention of further CVA

## 2014-05-24 NOTE — Assessment & Plan Note (Addendum)
Afebrile, lethargy some improved. Completed 3 days of Rocephin. Final C and S not returned. Recheck UA C and S for eradication due to continued weakness, lethargy, and decreased appetite.

## 2014-05-24 NOTE — Assessment & Plan Note (Signed)
Nuedexta discontinued. Family is concerned for lethargy with this med, although the med was stopped 5 days ago and she remains some what lethargic.

## 2014-05-24 NOTE — Assessment & Plan Note (Signed)
Continue Nexium 

## 2014-05-24 NOTE — Assessment & Plan Note (Signed)
Overall decline with progressive weakness and trouble swallowing. Goals of care are comfort based, per Sharon Harper, her daughter. She would still like treatment for infections/other illlnesses at this time.

## 2014-05-24 NOTE — Assessment & Plan Note (Addendum)
Has a hx of smoking but no PFTs for review. Was on Advair but was discontinued due to inability to use. Currently on Duonebs q 8 hrs. No wheezing but some coughing with meals still noted. Will make duonebs BID.  ST to eval and tx for dysphagia.

## 2014-05-25 ENCOUNTER — Non-Acute Institutional Stay (SKILLED_NURSING_FACILITY): Payer: Medicare Other | Admitting: Internal Medicine

## 2014-05-25 DIAGNOSIS — I69391 Dysphagia following cerebral infarction: Secondary | ICD-10-CM

## 2014-05-25 DIAGNOSIS — R410 Disorientation, unspecified: Secondary | ICD-10-CM

## 2014-05-25 DIAGNOSIS — G309 Alzheimer's disease, unspecified: Secondary | ICD-10-CM

## 2014-05-25 DIAGNOSIS — N3000 Acute cystitis without hematuria: Secondary | ICD-10-CM

## 2014-05-25 DIAGNOSIS — I639 Cerebral infarction, unspecified: Secondary | ICD-10-CM

## 2014-05-25 DIAGNOSIS — F0280 Dementia in other diseases classified elsewhere without behavioral disturbance: Secondary | ICD-10-CM

## 2014-05-25 NOTE — Progress Notes (Signed)
Patient ID: Sharon Harper, female   DOB: [birth year], 74 y.o.   MRN: [id number]  Location:  [facility] Provider:  Gwenith Spitz. Renato Gails, D.O., C.M.D.  Code Status:  DNR  Chief Complaint  Patient presents with  . Acute Visit    was treated for a UTI, follow UA was done per family request    HPI:  74 yo white female long term care resident of memory care was seen for an acute visit due to results of UA returning.  They are entirely negative.  Sharon Harper has taken a sharp decline since last week--she has become weak, lethargic, is having increased dysphagia and also has been having left hemineglect and facial droop.  She is unable to follow commands during her visit.  Her family friend who visits a few days per week was present and care plan was discussed with her.  She is currently on pureed foods and thickened liquids off a spoon.  She did have some coughing this am.  When she was seen yesterday by NP, ASA was added to her regimen due to suspected new stroke.  Review of Systems:  Review of Systems  Unable to perform ROS: dementia    Medications: Patient's Medications  New Prescriptions   No medications on file  Previous Medications   ASPIRIN 81 MG TABLET    Take 81 mg by mouth daily.   DOCUSATE SODIUM (COLACE) 100 MG CAPSULE    Take 100 mg by mouth as directed. Take 2 tablets once daily at bedtime for stool softener.   DONEPEZIL (ARICEPT) 10 MG TABLET    Take 10 mg by mouth at bedtime. Take 1 daily for memory.   ESOMEPRAZOLE (NEXIUM) 40 MG CAPSULE    Take 40 mg by mouth daily at 12 noon.   IPRATROPIUM-ALBUTEROL (DUONEB) 0.5-2.5 (3) MG/3ML SOLN    Take 3 mLs by nebulization 2 times daily at 12 noon and 4 pm.   LORAZEPAM (ATIVAN) 0.5 MG TABLET    Take 0.5 mg by mouth every 8 (eight) hours. Take one tablet every 8 hours as needed for anxiety/agitation.   MEMANTINE HCL ER (NAMENDA XR) 28 MG CP24    Take by mouth every morning. Take 1 daily to help maintain functional status.   SERTRALINE (ZOLOFT) 100 MG TABLET    Take 50 mg by mouth daily. Give total  once a morning  Modified Medications   No medications on file  Discontinued Medications   No medications on file    Physical Exam: Filed Vitals:   05/25/14 1119  BP: 133/74  Pulse: 80  Temp: 99.1 F (37.3 C)  Resp: 16  Weight: 139 lb (63.05 kg)   Physical Exam  Cardiovascular: Normal rate, regular rhythm and normal heart sounds.   Pulmonary/Chest: Breath sounds normal.  Unable to hear any rhonchi, but resident is not able to easily follow commands for deep breaths  Neurological: She is alert. A cranial nerve deficit is present. She exhibits abnormal muscle tone. Coordination abnormal.  Difficulty following commands due to dementia and lethargy but appears to have left hemineglect; also hemiparesis--foot coming off pedal of wheelchair, slightly flaccid; appears to have slight facial droop;  Friend feels she is more alert today and yesterday than Friday, sat, sun  Skin: Skin is warm and dry.     Labs reviewed: Basic Metabolic Panel:  Recent Labs  14/78/29  NA 141  K 3.9  BUN 13  CREATININE 0.7   CBC:  Recent Labs  12/03/13  WBC 6.4  HGB 12.5  HCT 36  PLT 267   05/24/14: Repeat UA entirely clear    Assessment/Plan 1. Acute CVA (cerebrovascular accident) -stroke in evolution may have been cause of delirium from outset -cont baby asa   2. Dysphagia S/P CVA (cerebrovascular accident) -this has been progressive -will r/o aspiration due to her low grade temp (this could simply be due to stroke in evolution, however) with chest xray  3. Alzheimer's disease -late stage, remains on aricept and namenda XR (could change to namzaric to decrease pill burden--this can be opened and sprinkled in food)   4. Acute cystitis without hematuria -UTI was treated with rocephin and cleared with some improvement in mentation but notable new neurologic deficits persist  5. Acute delirium -suspect this  was primarily due to new stroke and UTI was an incidental finding (may have been colonized--UA positive and was immediately treated due to her significant lethargy, weakness, decline)  Family/ staff Communication: discussed with close family friend, nurse  Goals of care: comfort but her daughter did request that infections that can be managed here be treated--would discuss hospice if she is not improving with her swallowing abilities or stroke continues to progress.  I would not recommend IVF or tube feeding in this case b/c of her baseline advanced dementia.    Labs/tests ordered:  Chest xray, continued ST

## 2014-05-29 ENCOUNTER — Encounter: Payer: Self-pay | Admitting: Internal Medicine

## 2014-06-24 ENCOUNTER — Non-Acute Institutional Stay: Payer: Medicare Other | Admitting: Adult Health

## 2014-06-24 DIAGNOSIS — K219 Gastro-esophageal reflux disease without esophagitis: Secondary | ICD-10-CM

## 2014-06-24 DIAGNOSIS — F482 Pseudobulbar affect: Secondary | ICD-10-CM

## 2014-06-24 DIAGNOSIS — K5901 Slow transit constipation: Secondary | ICD-10-CM

## 2014-06-24 DIAGNOSIS — I639 Cerebral infarction, unspecified: Secondary | ICD-10-CM

## 2014-06-24 DIAGNOSIS — R05 Cough: Secondary | ICD-10-CM

## 2014-06-24 DIAGNOSIS — F0391 Unspecified dementia with behavioral disturbance: Secondary | ICD-10-CM

## 2014-06-24 DIAGNOSIS — R1314 Dysphagia, pharyngoesophageal phase: Secondary | ICD-10-CM | POA: Insufficient documentation

## 2014-06-24 DIAGNOSIS — R059 Cough, unspecified: Secondary | ICD-10-CM

## 2014-06-24 DIAGNOSIS — R634 Abnormal weight loss: Secondary | ICD-10-CM

## 2014-06-24 DIAGNOSIS — R5381 Other malaise: Secondary | ICD-10-CM

## 2014-06-24 DIAGNOSIS — F03918 Unspecified dementia, unspecified severity, with other behavioral disturbance: Secondary | ICD-10-CM

## 2014-06-24 NOTE — Progress Notes (Signed)
Patient ID: Sharon Harper, female   DOB: 01-15-1941, 74 y.o.   MRN: [id number]     [facility]urs[facility]   Place of Service: ALF [phone])  Chief Complaint  Patient presents with  . Medical Management of Chronic Issues    HPI:  This is a 74 y.o. female [facility], [facility].  I am here to review her chronic medical issues. She has a hx of AD with behavioral disturbance.   Her VS have been stable over the past month. Her weight has decreased by 10 lbs over the past 3 months, currently at 138 lbs. She has functionally declined over the past several months. She had an episode of left sided neglect in Jan of 2016 and was started on asa therapy and since that time she has declined. She has dysphagia and is on NTL. She is working with PT/OT but no longer walks. She is minimally verbal and does not follow commands with consistency. Her daughter is aware of these changes but has decided not to be aggressive due to her dementia and overall debility.    Review of Systems:  Review of Systems  Unable to perform ROS: Dementia    Medications: Patient's Medications  New Prescriptions   No medications on file  Previous Medications   ASPIRIN 81 MG TABLET    Take 81 mg by mouth daily.   DOCUSATE SODIUM (COLACE) 100 MG CAPSULE    Take 100 mg by mouth as directed. Take 2 tablets once daily at bedtime for stool softener.   DONEPEZIL (ARICEPT) 10 MG TABLET    Take 10 mg by mouth at bedtime. Take 1 daily for memory.   ESOMEPRAZOLE (NEXIUM) 40 MG CAPSULE    Take 40 mg by mouth daily at 12 noon.   IPRATROPIUM-ALBUTEROL (DUONEB) 0.5-2.5 (3) MG/3ML SOLN    Take 3 mLs by nebulization 2 times daily at 12 noon and 4 pm.   LORAZEPAM (ATIVAN) 0.5 MG TABLET    Take 0.5 mg by mouth every 8 (eight) hours. Take one tablet every 8 hours as needed for anxiety/agitation.   MEMANTINE HCL ER (NAMENDA XR) 28 MG CP24    Take by mouth every morning. Take  1 daily to help maintain functional status.   SERTRALINE (ZOLOFT) 100 MG TABLET    Take 50 mg by mouth daily. Give total 150mg  once a morning  Modified Medications   No medications on file  Discontinued Medications   No medications on file     Physical Exam:  Filed Vitals:   06/24/14 1008  BP: 110/70  Pulse: 88  Temp: 98.3 F (36.8 C)  Resp: 18  Weight: 138 lb (62.596 kg)  SpO2: 96%    Physical Exam  Constitutional: She is well-developed, well-nourished, and in no distress. No distress.  HENT:  Head: Normocephalic.  Nose: Nose normal.  Mouth/Throat: Oropharynx is clear and moist. No oropharyngeal exudate.  Eyes: Conjunctivae are normal. Pupils are equal, round, and reactive to light. Right eye exhibits no discharge. Left eye exhibits no discharge.  Neck: Neck supple. No JVD present. No thyromegaly present.  Cardiovascular: Normal rate, regular rhythm and normal heart sounds.   No murmur heard. Pulmonary/Chest: Effort normal and breath sounds normal. No respiratory distress.  Abdominal: Soft. Bowel sounds are normal. She exhibits no distension. There is no tenderness.  Musculoskeletal: She exhibits no edema or tenderness.  Lymphadenopathy:    She has no cervical adenopathy.  Neurological: She  is alert. No cranial nerve deficit.  Strength 2/5 on the RUE, 3/5 on the left, can not follow full neuro exam. No obvious focal deficit noted  Skin: Skin is warm and dry. She is not diaphoretic.  Psychiatric:  Flat affect, minimally verbal.      Labs reviewed/Significant Diagnostic Results:  Basic Metabolic Panel:  Recent Labs  81/19/14 05/21/14  NA 141 143  K 3.9 3.8  BUN 13 14  CREATININE 0.7 0.8   Liver Function Tests: No results for input(s): AST, ALT, ALKPHOS, BILITOT, PROT, ALBUMIN in the last 8760 hours. No results for input(s): LIPASE, AMYLASE in the last 8760 hours. No results for input(s): AMMONIA in the last 8760 hours. CBC:  Recent Labs  12/03/13  05/21/14  WBC 6.4 8.9  HGB 12.5 11.3*  HCT 36 34*  PLT 267 266   Lab Results  Component Value Date   TSH 3.24 04/17/2013     Assessment/Plan  1. Dementia with behavioral disturbance Functional decline over time. Continues on aricept and namenda. 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  There is unfortunately no other med for this. Since the neuro event in Jan her episodes of crying have reduced significantly per the staff  3. Cough She has a hx of smoking and previously used Advair. No she is on Duonebs due to cough. No PFTs for review.  4. CVA (cerebral vascular accident) Her focal deficit was first noted on the left but today she seemed weaker on the right. She clearly has had a functional decline and should continue to work with PT and OT, although I do not expect a robust response.Continue aspirin.    5. Gastroesophageal reflux disease without esophagitis Continue Nexium due to cough and h/o reflux  6. Physical deconditioning Continue with PT and OT  7. Slow transit constipation Stable on Colace  8. Dysphagia Followed by ST. Improvement in sputum production and cough with puree diet and NTL.  9.Weight loss Has lost 10 lbs, most likely due to functional decline and swallowing issues. Will check TSH.   Sharon Ley, ANP [facility] [phone]

## 2014-06-29 LAB — TSH: TSH: 3.39 u[IU]/mL (ref <=5.90)

## 2014-08-17 ENCOUNTER — Encounter: Payer: Self-pay | Admitting: Adult Health

## 2014-08-17 ENCOUNTER — Non-Acute Institutional Stay: Payer: Medicare Other | Admitting: Adult Health

## 2014-08-17 DIAGNOSIS — I639 Cerebral infarction, unspecified: Secondary | ICD-10-CM | POA: Diagnosis not present

## 2014-08-17 DIAGNOSIS — R1314 Dysphagia, pharyngoesophageal phase: Secondary | ICD-10-CM | POA: Diagnosis not present

## 2014-08-17 DIAGNOSIS — R634 Abnormal weight loss: Secondary | ICD-10-CM | POA: Diagnosis not present

## 2014-08-17 DIAGNOSIS — R05 Cough: Secondary | ICD-10-CM

## 2014-08-17 DIAGNOSIS — K219 Gastro-esophageal reflux disease without esophagitis: Secondary | ICD-10-CM | POA: Diagnosis not present

## 2014-08-17 DIAGNOSIS — M21371 Foot drop, right foot: Secondary | ICD-10-CM

## 2014-08-17 DIAGNOSIS — R059 Cough, unspecified: Secondary | ICD-10-CM

## 2014-08-17 DIAGNOSIS — F482 Pseudobulbar affect: Secondary | ICD-10-CM | POA: Diagnosis not present

## 2014-08-17 DIAGNOSIS — G309 Alzheimer's disease, unspecified: Secondary | ICD-10-CM | POA: Diagnosis not present

## 2014-08-17 DIAGNOSIS — F0280 Dementia in other diseases classified elsewhere without behavioral disturbance: Secondary | ICD-10-CM

## 2014-08-19 ENCOUNTER — Encounter: Payer: Self-pay | Admitting: Adult Health

## 2014-08-19 DIAGNOSIS — M21379 Foot drop, unspecified foot: Secondary | ICD-10-CM | POA: Insufficient documentation

## 2014-08-19 LAB — BASIC METABOLIC PANEL
BUN: 13 mg/dL (ref 4–21)
CREATININE: 0.9 mg/dL (ref 0.5–1.1)
GLUCOSE: 89 mg/dL
Potassium: 3.9 mmol/L (ref 3.4–5.3)
SODIUM: 142 mmol/L (ref 137–147)

## 2014-08-19 LAB — CBC AND DIFFERENTIAL
HEMATOCRIT: 36 % (ref 36–46)
Hemoglobin: 12.1 g/dL (ref 12.0–16.0)
Platelets: 241 10*3/uL (ref 150–399)
WBC: 6.7 10^3/mL

## 2014-08-19 NOTE — Progress Notes (Signed)
Patient ID: Sharon Harper, female   DOB: [birth year], 74 y.o.   MRN: [id number]     Nursing Home Location:  [facility]   Place of Service: ALF [phone])  Chief Complaint  Patient presents with  . Medical Management of Chronic Issues    HPI:  This is a 74 y.o. female residing at [facility], memory care section.  I am here to review her chronic medical issues. She has a hx of AD with behavioral disturbance.   Her VS have been stable over the past month. She continues to lose weight with decreased intake, currently at 130 lbs. . She has functionally declined over the past several months. She had an episode of left sided neglect in Jan of 2016 and was started on asa therapy and since that time she has declined. She has dysphagia and is on NTL.She is minimally verbal and does not follow commands with consistency.  She is currently using a Geri chair for support. She had some issues with anxiety and PBA but has not used any Ativan in the past few weeks.  Recently after a pharmacy review nexium was discontinued. There have been no reports of indigestion.    Review of Systems:  Review of Systems  Unable to perform ROS: Dementia    Medications: Patient's Medications  New Prescriptions   No medications on file  Previous Medications   ASPIRIN 81 MG TABLET    Take 81 mg by mouth daily.   DOCUSATE SODIUM (COLACE) 100 MG CAPSULE    Take 100 mg by mouth as directed. Take 2 tablets once daily at bedtime for stool softener.   DONEPEZIL (ARICEPT) 10 MG TABLET    Take 10 mg by mouth at bedtime. Take 1 daily for memory.   IPRATROPIUM-ALBUTEROL (DUONEB) 0.5-2.5 (3) MG/3ML SOLN    Take 3 mLs by nebulization 2 times daily at 12 noon and 4 pm.   LORAZEPAM (ATIVAN) 0.5 MG TABLET    Take 0.5 mg by mouth every 8 (eight) hours. Take one tablet every 8 hours as needed for anxiety/agitation.   MEMANTINE HCL ER (NAMENDA XR) 28 MG CP24    Take by mouth every morning. Take 1  daily to help maintain functional status.   SERTRALINE (ZOLOFT) 100 MG TABLET    Take 50 mg by mouth daily. Give total  once a morning  Modified Medications   No medications on file  Discontinued Medications   ESOMEPRAZOLE (NEXIUM) 40 MG CAPSULE    Take 40 mg by mouth daily at 12 noon.     Physical Exam:  Filed Vitals:   08/19/14 1054  BP: 110/66  Pulse: 78  Temp: 96.8 F (36 C)  Resp: 16  Weight: 130 lb (58.968 kg)  SpO2: 97%    Physical Exam  Constitutional: She is well-developed, well-nourished, and in no distress. No distress.  HENT:  Head: Normocephalic.  Nose: Nose normal.  Mouth/Throat: Oropharynx is clear and moist. No oropharyngeal exudate.  Eyes: Conjunctivae are normal. Pupils are equal, round, and reactive to light. Right eye exhibits no discharge. Left eye exhibits no discharge.  Neck: Neck supple. No JVD present. No thyromegaly present.  Cardiovascular: Normal rate, regular rhythm and normal heart sounds.   No murmur heard. Pulmonary/Chest: Effort normal and breath sounds normal. No respiratory distress.  Abdominal: Soft. Bowel sounds are normal. She exhibits no distension. There is no tenderness.  Musculoskeletal: She exhibits no edema or tenderness.  Lymphadenopathy:    She has no  cervical adenopathy.  Neurological: She is alert. No cranial nerve deficit.  Can not follow full neuro exam. No obvious facial focal deficit noted with some decreased movement of the right upper ext when compared to the left. Foot drop noted on the right.   Skin: Skin is warm and dry. She is not diaphoretic.  Psychiatric:  Flat affect, minimally verbal.      Labs reviewed/Significant Diagnostic Results:  Basic Metabolic Panel:  Recent Labs  16/02/9606/23/15 05/21/14  NA 141 143  K 3.9 3.8  BUN 13 14  CREATININE 0.7 0.8   Liver Function Tests: No results for input(s): AST, ALT, ALKPHOS, BILITOT, PROT, ALBUMIN in the last 8760 hours. No results for input(s): LIPASE,  AMYLASE in the last 8760 hours. No results for input(s): AMMONIA in the last 8760 hours. CBC:  Recent Labs  12/03/13 05/21/14  WBC 6.4 8.9  HGB 12.5 11.3*  HCT 36 34*  PLT 267 266   Lab Results  Component Value Date   TSH 3.39 06/29/2014     Assessment/Plan  1. Dementia with behavioral disturbance Functional decline over time. Continues on aricept and namenda.   2. Pseudobulbar affect Improved  3. Cough She has a hx of smoking and previously used Advair. No she is on Duonebs per the family request. There are no reports of SOB or cough.   4. CVA (cerebral vascular accident) Stable, continue ASA  5. Gastroesophageal reflux disease without esophagitis Nexium discontinued. Agree, monitor for indigestion  6. Dysphagia Continue NTL and puree diet  7.Weight loss TSH normal. Continue to monitor weight. Assistance for all meals. No aggressive intervention due to family wishes  8. Foot drop OT eval and tx  Labs CBC and BMP  Overall poor prognosis due to dementia, complicated by CVA and noted functional decline.    Peggye Leyhristy Jorge Amparo, ANP [facility] [phone]

## 2014-10-22 ENCOUNTER — Non-Acute Institutional Stay: Payer: Medicare Other | Admitting: Adult Health

## 2014-10-22 ENCOUNTER — Encounter: Payer: Self-pay | Admitting: Adult Health

## 2014-10-22 DIAGNOSIS — F0391 Unspecified dementia with behavioral disturbance: Secondary | ICD-10-CM

## 2014-10-22 DIAGNOSIS — I639 Cerebral infarction, unspecified: Secondary | ICD-10-CM

## 2014-10-22 DIAGNOSIS — R634 Abnormal weight loss: Secondary | ICD-10-CM | POA: Diagnosis not present

## 2014-10-22 DIAGNOSIS — F03918 Unspecified dementia, unspecified severity, with other behavioral disturbance: Secondary | ICD-10-CM

## 2014-10-22 DIAGNOSIS — R05 Cough: Secondary | ICD-10-CM

## 2014-10-22 DIAGNOSIS — I2510 Atherosclerotic heart disease of native coronary artery without angina pectoris: Secondary | ICD-10-CM | POA: Diagnosis not present

## 2014-10-22 DIAGNOSIS — R1314 Dysphagia, pharyngoesophageal phase: Secondary | ICD-10-CM

## 2014-10-22 DIAGNOSIS — K5901 Slow transit constipation: Secondary | ICD-10-CM

## 2014-10-22 DIAGNOSIS — R059 Cough, unspecified: Secondary | ICD-10-CM

## 2014-10-22 NOTE — Progress Notes (Addendum)
Patient ID: Sharon Harper, female   DOB: 1940/08/05, 74 y.o.   MRN: [id number]     Nursing Home Location:  [facility]   Place of Service: ALF [phone])  Chief Complaint  Patient presents with  . Medical Management of Chronic Issues    HPI:  This is a 74 y.o. female residing at [facility], memory care section.  I am here to review her chronic medical issues. She has a hx of AD with behavioral disturbance.   Her VS have been stable over the past month. She continues to lose weight with decreased intake, currently at 125 lbs. She has a hx of pseudobulbar affect but the nurses state that this has improved over time with less crying and laughing outbursts. She has dysphagia and is on NTL.She continues to cough with meals occasionally but has not had any episodes of pna.  She is minimally verbal and does not follow commands with consistency.  She is currently using a Geri chair for support and has a foot extension that helps to prevent foot drop.    Review of Systems:  Review of Systems  Unable to perform ROS: Dementia    Medications: Patient's Medications  New Prescriptions   No medications on file  Previous Medications   ASPIRIN 81 MG TABLET    Take 81 mg by mouth daily.   DONEPEZIL (ARICEPT) 10 MG TABLET    Take 10 mg by mouth at bedtime. Take 1 daily for memory.   IPRATROPIUM-ALBUTEROL (DUONEB) 0.5-2.5 (3) MG/3ML SOLN    Take 3 mLs by nebulization 2 times daily at 12 noon and 4 pm.   LORAZEPAM (ATIVAN) 0.5 MG TABLET    Take 0.5 mg by mouth every 8 (eight) hours. Take one tablet every 8 hours as needed for anxiety/agitation.   MEMANTINE HCL ER (NAMENDA XR) 28 MG CP24    Take by mouth every morning. Take 1 daily to help maintain functional status.   POLYETHYLENE GLYCOL (MIRALAX / GLYCOLAX) PACKET    Take 17 g by mouth daily.   SENNOSIDES-DOCUSATE SODIUM (SENOKOT-S) 8.6-50 MG TABLET    Take 1 tablet by mouth 2 (two) times daily.   SERTRALINE  (ZOLOFT) 100 MG TABLET    Take 50 mg by mouth daily. Give total  once a morning  Modified Medications   No medications on file  Discontinued Medications   DOCUSATE SODIUM (COLACE) 100 MG CAPSULE    Take 100 mg by mouth as directed. Take 2 tablets once daily at bedtime for stool softener.     Physical Exam:  Filed Vitals:   10/22/14 1107  BP: 113/66  Pulse: 72  Temp: 98.5 F (36.9 C)  Resp: 18  SpO2: 97%    Physical Exam  Constitutional: She is well-developed, well-nourished, and in no distress. No distress.  HENT:  Head: Normocephalic.  Nose: Nose normal.  Mouth/Throat: Oropharynx is clear and moist. No oropharyngeal exudate.  Eyes: Conjunctivae are normal. Pupils are equal, round, and reactive to light. Right eye exhibits no discharge. Left eye exhibits no discharge.  Neck: Neck supple. No JVD present. No thyromegaly present.  Cardiovascular: Normal rate, regular rhythm and normal heart sounds.   No murmur heard. Pulmonary/Chest: Effort normal and breath sounds normal. No respiratory distress.  Abdominal: Soft. Bowel sounds are normal. She exhibits no distension. There is no tenderness.  Musculoskeletal: She exhibits no edema.  Pain with ROM to the right shoulder with no edema, point tenderness or deformity  Lymphadenopathy:  She has no cervical adenopathy.  Neurological: She is alert. No cranial nerve deficit.  Can not follow full neuro exam. No obvious facial focal deficit noted with some decreased movement of the right upper ext when compared to the left.  Skin: Skin is warm and dry. She is not diaphoretic.  Psychiatric:  Flat affect, minimally verbal.      Labs reviewed/Significant Diagnostic Results:  Basic Metabolic Panel:  Recent Labs  62/13/08 05/21/14 08/19/14  NA 141 143 142  K 3.9 3.8 3.9  BUN 13 14 13   CREATININE 0.7 0.8 0.9   Liver Function Tests: No results for input(s): AST, ALT, ALKPHOS, BILITOT, PROT, ALBUMIN in the last 8760  hours. No results for input(s): LIPASE, AMYLASE in the last 8760 hours. No results for input(s): AMMONIA in the last 8760 hours. CBC:  Recent Labs  12/03/13 05/21/14 08/19/14  WBC 6.4 8.9 6.7  HGB 12.5 11.3* 12.1  HCT 36 34* 36  PLT 267 266 241   Lab Results  Component Value Date   TSH 3.39 06/29/2014     Assessment/Plan  1. Dementia with behavioral disturbance Functional decline over time. Continues on aricept and namenda for any benefit this may be providing with behaviors.  2. Pseudobulbar affect Improved  3. Cough She has a hx of smoking and previously used Advair. Now she is on Duonebs per the family request. There are no reports of SOB or cough.   4. CVA (cerebral vascular accident) Stable, continue ASA  5. Dysphagia Continue NTL and puree diet  7.Weight loss Continues to lose weight despite boost supplement twice daily. No intervention at this point. Continue to monitor weights and assist with meals  8. CAD S/p cabg, on aspirin therapy. No lipid monitoring due to age and debility.    9. Constipation Improved. Continue miralax and senna s   Peggye Ley, ANP [facility] [phone]

## 2014-12-07 ENCOUNTER — Non-Acute Institutional Stay: Payer: Medicare Other | Admitting: Internal Medicine

## 2014-12-07 DIAGNOSIS — I2510 Atherosclerotic heart disease of native coronary artery without angina pectoris: Secondary | ICD-10-CM

## 2014-12-07 DIAGNOSIS — M21371 Foot drop, right foot: Secondary | ICD-10-CM | POA: Diagnosis not present

## 2014-12-07 DIAGNOSIS — G309 Alzheimer's disease, unspecified: Secondary | ICD-10-CM | POA: Diagnosis not present

## 2014-12-07 DIAGNOSIS — R1314 Dysphagia, pharyngoesophageal phase: Secondary | ICD-10-CM | POA: Diagnosis not present

## 2014-12-07 DIAGNOSIS — I639 Cerebral infarction, unspecified: Secondary | ICD-10-CM

## 2014-12-07 DIAGNOSIS — K5901 Slow transit constipation: Secondary | ICD-10-CM

## 2014-12-07 DIAGNOSIS — F0280 Dementia in other diseases classified elsewhere without behavioral disturbance: Secondary | ICD-10-CM

## 2014-12-07 DIAGNOSIS — R634 Abnormal weight loss: Secondary | ICD-10-CM

## 2014-12-07 NOTE — Progress Notes (Signed)
Patient ID: Sharon Harper, female   DOB: 07/15/1940, 74 y.o.   MRN: [id number]  Location:  [facility] ([place]) Provider:  Gwenith Spitz. Renato Gails, D.O., C.M.D.  Code Status:  DNR Goals of care: Advanced Directive information Does patient have an advance directive?: Yes, Type of Advance Directive: Healthcare Power of [place];Living will;Out of facility DNR (pink MOST or yellow form), Pre-existing out of facility DNR order (yellow form or pink MOST form): Pink MOST form placed in chart (order not valid for inpatient use), Does patient want to make changes to advanced directive?: No - Patient declined  Chief Complaint  Patient presents with  . Medical Management of Chronic Issues    HPI:  74 yo white female with h/o Alzheimer's disease which may be mixed with vascular dementia, prior stroke event, dysphagia, depression, CAD, iron deficiency anemia and weight loss was seen for medical mgt of her chronic diseases.  Her friend was there and they were playing cards when I arrived.  She spoke a few words when asked questions.  She was much more alert than she has been during prior visits today.  Her friend notes that she has continued to lose weight thought she does understand it's part of her dementia progressing.  They continue to feed her when she is wanting to eat and provide supplements.  There are no new concerns.   Review of Systems: obtained from friend and nursing staff Review of Systems  Constitutional: Positive for weight loss. Negative for fever and chills.  Respiratory: Negative for shortness of breath.   Cardiovascular: Negative for chest pain.  Gastrointestinal: Positive for constipation. Negative for abdominal pain, blood in stool and melena.  Genitourinary: Negative for dysuria.  Musculoskeletal: Negative for falls.  Skin: Negative for rash.  Neurological: Positive for speech change and focal weakness. Negative for loss of consciousness.  Endo/Heme/Allergies: Bruises/bleeds  easily.  Psychiatric/Behavioral: Positive for depression and memory loss. The patient does not have insomnia.     Past Medical History  Diagnosis Date  . Unspecified constipation 06/24/2012  . Loss of weight 06/24/2012  . Dyspepsia and other specified disorders of function of stomach 06/04/2011  . Osteoarthrosis, unspecified whether generalized or localized, unspecified site 06/04/2011  . Unspecified paranoid state 03/14/2011  . Alzheimer's disease 11/13/2010  . Mild cognitive impairment, so stated 10/31/2010  . Senile cataract, unspecified 10/31/2010  . Coronary atherosclerosis of native coronary artery 10/31/2010  . Bunion 10/31/2010  . Abnormal weight gain 10/25/2010  . Other and unspecified hyperlipidemia 10/24/2010  . Iron deficiency anemia, unspecified 10/24/2010  . Depressive disorder, not elsewhere classified 10/24/2010  . Unspecified essential hypertension 10/24/2010  . Chronic airway obstruction, not elsewhere classified 10/24/2010  . Wrist fracture, right 10/09/2013    Patient Active Problem List   Diagnosis Date Noted  . Foot drop 08/19/2014  . CVA (cerebral vascular accident) 06/24/2014  . Physical deconditioning 06/24/2014  . Dysphagia, pharyngoesophageal phase 06/24/2014  . GERD (gastroesophageal reflux disease) 05/04/2014  . Pseudobulbar affect 04/14/2014  . Wrist fracture, right 10/09/2013  . Edema 06/09/2013  . SOB (shortness of breath) 06/09/2013  . Gait disturbance 06/09/2013  . Urinary tract infection, site not specified 03/15/2013  . Dementia with behavioral disturbance 03/12/2013  . HCAP (healthcare-associated pneumonia) 03/08/2013  . Constipation 06/24/2012  . Loss of weight 06/24/2012  . Alzheimer's disease 11/13/2010  . Coronary atherosclerosis of native coronary artery 10/31/2010  . Depression 10/24/2010  . Other and unspecified hyperlipidemia 10/24/2010    No Known Allergies  Medications: Patient's Medications  New Prescriptions   No  medications on file  Previous Medications   ASPIRIN 81 MG TABLET    Take 81 mg by mouth daily.   DONEPEZIL (ARICEPT) 10 MG TABLET    Take 10 mg by mouth at bedtime. Take 1 daily for memory.   IPRATROPIUM-ALBUTEROL (DUONEB) 0.5-2.5 (3) MG/3ML SOLN    Take 3 mLs by nebulization 2 times daily at 12 noon and 4 pm.   LORAZEPAM (ATIVAN) 0.5 MG TABLET    Take 0.5 mg by mouth every 8 (eight) hours. Take one tablet every 8 hours as needed for anxiety/agitation.   MEMANTINE HCL ER (NAMENDA XR) 28 MG CP24    Take by mouth every morning. Take 1 daily to help maintain functional status.   POLYETHYLENE GLYCOL (MIRALAX / GLYCOLAX) PACKET    Take 17 g by mouth daily.   SENNOSIDES-DOCUSATE SODIUM (SENOKOT-S) 8.6-50 MG TABLET    Take 1 tablet by mouth 2 (two) times daily.   SERTRALINE (ZOLOFT) 100 MG TABLET    Take 50 mg by mouth daily. Give total 150mg  once a morning  Modified Medications   No medications on file  Discontinued Medications   No medications on file    Physical Exam: Filed Vitals:   12/07/14 1744  BP: 108/76  Pulse: 90  Temp: 96.9 F (36.1 C)  Resp: 16  Weight: 124 lb (56.246 kg)  SpO2: 97%   Body mass index is 21.27 kg/(m^2).  Physical Exam  Constitutional:  Thin white female seated in wheelchair with foam leg rests for positioning  Cardiovascular: Normal rate, regular rhythm, normal heart sounds and intact distal pulses.   Pulmonary/Chest: Effort normal and breath sounds normal.  Abdominal: Soft. Bowel sounds are normal.  Musculoskeletal:  Hemiparetic and dysarthric, nearly aphasic  Neurological: She is alert.  Skin: Skin is warm and dry.    Labs reviewed: Basic Metabolic Panel:  Recent Labs  16/10/96 08/19/14  NA 143 142  K 3.8 3.9  BUN 14 13  CREATININE 0.8 0.9    Liver Function Tests: No results for input(s): AST, ALT, ALKPHOS, BILITOT, PROT, ALBUMIN in the last 8760 hours.  CBC:  Recent Labs  05/21/14 08/19/14  WBC 8.9 6.7  HGB 11.3* 12.1  HCT 34* 36    PLT 266 241    Lab Results  Component Value Date   TSH 3.39 06/29/2014    Patient Care Team: Kermit Balo, DO as PCP - General (Geriatric Medicine) [facility] Fletcher Anon, NP as Nurse Practitioner (Nurse Practitioner)  Assessment/Plan 1. Alzheimer's disease -likely mixed with vascular dementia given prior stroke several months ago -continues on aricept and namenda XR--if insurance would cover namzaric, this could decrease pill burden further -is in late stages at this time--speaks minimally, not ambulatory and has more and more difficulty speaking and swallowing, losing weight  2. CVA (cerebral vascular accident) -with some right-sided weakness and right foot drop--seems cushioned footrests generally work better for her so her feet don't end up to one side -has led also to worsened dysarthria, dysphagia -being treated for depression also with zoloft  3. Dysphagia, pharyngoesophageal phase -eating varies per friend -is fed and sometimes will eat, other times, won't -cont supplements  4. Slow transit constipation -cont senna s and miralax for this, adequate fluids -cannot be very active which interferes with bowel function  5. Atherosclerosis of native coronary artery of native heart without angina pectoris -no symptoms -no longer on meds to treat  this due to her advanced dementia--only remaining is her baby asa  6. 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 Loss of weight -ongoing and progressive due to her decline from dementia despite friend, staff best efforts  Family/ staff Communication: discussed with friend visiting and nursing staff  Labs/tests ordered:  No new due to goals of care being comfort  Merary Garguilo L. Ihan Pat, D.O. Geriatrics [facility] [address], [place] [address] Cell Phone (Mon-Fri 8am-5pm):  [phone] On Call:  [phone] & follow prompts after 5pm &  weekends Office Phone:  [phone] Office Fax:  [phone]

## 2014-12-12 ENCOUNTER — Encounter: Payer: Self-pay | Admitting: Internal Medicine

## 2014-12-12 NOTE — Addendum Note (Signed)
Addended by: Bufford Spikes L on: 12/12/2014 10:11 AM   Modules accepted: Level of Service

## 2015-01-27 ENCOUNTER — Encounter: Payer: Self-pay | Admitting: Adult Health

## 2015-01-27 ENCOUNTER — Non-Acute Institutional Stay: Payer: Medicare Other | Admitting: Adult Health

## 2015-01-27 DIAGNOSIS — F03918 Unspecified dementia, unspecified severity, with other behavioral disturbance: Secondary | ICD-10-CM

## 2015-01-27 DIAGNOSIS — J449 Chronic obstructive pulmonary disease, unspecified: Secondary | ICD-10-CM | POA: Insufficient documentation

## 2015-01-27 DIAGNOSIS — R112 Nausea with vomiting, unspecified: Secondary | ICD-10-CM | POA: Diagnosis not present

## 2015-01-27 DIAGNOSIS — J432 Centrilobular emphysema: Secondary | ICD-10-CM | POA: Diagnosis not present

## 2015-01-27 DIAGNOSIS — R634 Abnormal weight loss: Secondary | ICD-10-CM

## 2015-01-27 DIAGNOSIS — F0391 Unspecified dementia with behavioral disturbance: Secondary | ICD-10-CM | POA: Diagnosis not present

## 2015-01-27 DIAGNOSIS — K5901 Slow transit constipation: Secondary | ICD-10-CM

## 2015-01-27 DIAGNOSIS — R1314 Dysphagia, pharyngoesophageal phase: Secondary | ICD-10-CM

## 2015-01-27 NOTE — Progress Notes (Signed)
Patient ID: Sharon Harper, female   DOB: 09-Jun-1940, 73 y.o.   MRN: [id number]     Nursing Home Location:  [facility]   Place of Service: ALF [phone])  Chief Complaint  Patient presents with  . Acute Visit    vomiting  . Medical Management of Chronic Issues    HPI:  This is a 74 y.o. female residing at [facility], memory care section.  I am here to review her chronic medical issues, as well as address concerns regarding n/v. She has a hx of AD with behavioral disturbance and lives in the memory care unit.   She continues to lose weight with decreased intake, currently at 120 lbs.  She has dysphagia and is on NTL.She continues to cough with meals occasionally per the staff.   The staff reports that she threw up two times today. She does not have a fever or obvious pain, although this is difficult to assess due to her dementia. She has intermittently had this problem for several months per the staff.  She is having regular BM's and no foul smelling urine.   Review of Systems:  Review of Systems  Unable to perform ROS: Dementia    Medications: Patient's Medications  New Prescriptions   No medications on file  Previous Medications   ASPIRIN 81 MG TABLET    Take 81 mg by mouth daily.   IPRATROPIUM-ALBUTEROL (DUONEB) 0.5-2.5 (3) MG/3ML SOLN    Take 3 mLs by nebulization 2 times daily at 12 noon and 4 pm.   LORAZEPAM (ATIVAN) 0.5 MG TABLET    Take 0.5 mg by mouth every 8 (eight) hours. Take one tablet every 8 hours as needed for anxiety/agitation.   MEMANTINE HCL ER (NAMENDA XR) 28 MG CP24    Take by mouth every morning. Take 1 daily to help maintain functional status.   POLYETHYLENE GLYCOL (MIRALAX / GLYCOLAX) PACKET    Take 17 g by mouth daily.   SENNOSIDES-DOCUSATE SODIUM (SENOKOT-S) 8.6-50 MG TABLET    Take 1 tablet by mouth 2 (two) times daily.   SERTRALINE (ZOLOFT) 100 MG TABLET    Take 50 mg by mouth daily. Give total  once a morning    Modified Medications   No medications on file  Discontinued Medications   DONEPEZIL (ARICEPT) 10 MG TABLET    Take 10 mg by mouth at bedtime. Take 1 daily for memory.     Physical Exam:  Filed Vitals:   01/27/15 0936  BP: 118/80  Pulse: 78  Temp: 97.2 F (36.2 C)  Resp: 18  Weight: 120 lb 8 oz (54.658 kg)  SpO2: 93%   Wt Readings from Last 3 Encounters:  01/27/15 120 lb 8 oz (54.658 kg)  12/07/14 124 lb (56.246 kg)  08/19/14 130 lb (58.968 kg)    Physical Exam  Constitutional: She is well-developed, well-nourished, and in no distress. No distress.  HENT:  Head: Normocephalic.  Nose: Nose normal.  Mouth/Throat: Uvula is midline. Mucous membranes are not pale and not dry. Normal dentition.    Eyes: Conjunctivae are normal. Pupils are equal, round, and reactive to light. Right eye exhibits no discharge. Left eye exhibits no discharge.  Neck: Neck supple. No JVD present. No thyromegaly present.  Cardiovascular: Normal rate, regular rhythm and normal heart sounds.   No murmur heard. Pulmonary/Chest: Effort normal and breath sounds normal. No respiratory distress.  Abdominal: Soft. Bowel sounds are normal. She exhibits no distension and no mass. There is no  tenderness. There is no rebound and no guarding.  Lymphadenopathy:    She has no cervical adenopathy.  Neurological: She is alert. No cranial nerve deficit.  Can not follow full neuro exam. No obvious facial focal deficit noted. Was able to f/c to lift right and left leg off the chair correctly  Skin: Skin is warm and dry. She is not diaphoretic.  Psychiatric:  Sad face today      Labs reviewed/Significant Diagnostic Results:  Basic Metabolic Panel:  Recent Labs  16/10/96 08/19/14  NA 143 142  K 3.8 3.9  BUN 14 13  CREATININE 0.8 0.9   Liver Function Tests: No results for input(s): AST, ALT, ALKPHOS, BILITOT, PROT, ALBUMIN in the last 8760 hours. No results for input(s): LIPASE, AMYLASE in the last 8760  hours. No results for input(s): AMMONIA in the last 8760 hours. CBC:  Recent Labs  05/21/14 08/19/14  WBC 8.9 6.7  HGB 11.3* 12.1  HCT 34* 36  PLT 266 241   Lab Results  Component Value Date   TSH 3.39 06/29/2014     Assessment/Plan  1. Nausea and vomiting, vomiting of unspecified type -possibly due to aricept, will d/c and re eval, consider GI issue but would not pursue aggressive work up due to dementia -zofran 4 mg q 8 hr prn  2. Slow transit constipation -stable, continue current meds  3. Dysphagia, pharyngoesophageal phase -continue current diet, no bouts of aspiration   4. Dementia with behavioral disturbance -progressive, followed by hospice -continue namenda, but would have a low threshold to d/c  5. Loss of weight -continues to lose weight -d/c aricept as this could be a s/e although overall this is most likely due to a progression of dementia  6. Centrilobular emphysema -no symptoms at present  -continue duoneb BID  Labs CBC, CMP  Peggye Ley, ANP [facility] [phone]

## 2015-02-15 ENCOUNTER — Non-Acute Institutional Stay (SKILLED_NURSING_FACILITY): Payer: Medicare Other | Admitting: Internal Medicine

## 2015-02-15 DIAGNOSIS — F0150 Vascular dementia without behavioral disturbance: Secondary | ICD-10-CM | POA: Diagnosis not present

## 2015-02-15 DIAGNOSIS — G309 Alzheimer's disease, unspecified: Secondary | ICD-10-CM

## 2015-02-15 DIAGNOSIS — F0280 Dementia in other diseases classified elsewhere without behavioral disturbance: Secondary | ICD-10-CM

## 2015-02-15 DIAGNOSIS — M21371 Foot drop, right foot: Secondary | ICD-10-CM | POA: Diagnosis not present

## 2015-02-15 DIAGNOSIS — J432 Centrilobular emphysema: Secondary | ICD-10-CM | POA: Diagnosis not present

## 2015-02-15 DIAGNOSIS — R634 Abnormal weight loss: Secondary | ICD-10-CM

## 2015-02-15 DIAGNOSIS — R1314 Dysphagia, pharyngoesophageal phase: Secondary | ICD-10-CM | POA: Diagnosis not present

## 2015-02-15 DIAGNOSIS — R627 Adult failure to thrive: Secondary | ICD-10-CM | POA: Diagnosis not present

## 2015-02-15 DIAGNOSIS — I639 Cerebral infarction, unspecified: Secondary | ICD-10-CM | POA: Diagnosis not present

## 2015-02-15 DIAGNOSIS — R2990 Unspecified symptoms and signs involving the nervous system: Secondary | ICD-10-CM

## 2015-02-15 NOTE — Progress Notes (Signed)
Patient ID: Sharon Harper, female   DOB: 07/19/1940, 74 y.o.   MRN: [id number]  Location:  [facility] Provider:  Samarion Ehle L. Olson Lucarelli, D.O., C.M.D. Bufford Spikes, DO  Code Status:  DNR Goals of care: Advanced Directive information Does patient have an advance directive?: Yes, Type of Advance Directive: Healthcare Power of [place];Living will;Out of facility DNR (pink MOST or yellow form), Pre-existing out of facility DNR order (yellow form or pink MOST form): Pink MOST form placed in chart (order not valid for inpatient use);Yellow form placed in chart (order not valid for inpatient use), Does patient want to make changes to advanced directive?: No - Patient declined  Chief Complaint  Patient presents with  . Acute Visit    declining--weak, lethargic    HPI:  Pt is a 74 y.o. white female seen today for an acute visit due to decline in her mentation.  She is on hospice care for her cerebrovascular disease.  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  She has been dependent in ADLs for a long time now.  She is currently having more staring spells and limited responsiveness and interactivity with her environment.  Her daughter has some concerns that she is having pain--she does wince on occasion.  Location of pain is unclear.  She is essentially nonverbal (miminal speech).  Her friend is typically with her when I see her, but her daughter was present today.    ROS obtained from nursing staff and patient's daughter. Review of Systems  Unable to perform ROS: dementia  Constitutional: Positive for weight loss and malaise/fatigue. Negative for fever.  Respiratory: Negative for cough and shortness of breath.   Gastrointestinal: Negative for constipation.  Musculoskeletal: Negative for falls.  Neurological: Positive for weakness.  Psychiatric/Behavioral: Positive for memory loss. The patient does not have  insomnia.     Past Medical History  Diagnosis Date  . Unspecified constipation 06/24/2012  . Loss of weight 06/24/2012  . Dyspepsia and other specified disorders of function of stomach 06/04/2011  . Osteoarthrosis, unspecified whether generalized or localized, unspecified site 06/04/2011  . Unspecified paranoid state 03/14/2011  . Alzheimer's disease 11/13/2010  . Mild cognitive impairment, so stated 10/31/2010  . Senile cataract, unspecified 10/31/2010  . Coronary atherosclerosis of native coronary artery 10/31/2010  . Bunion 10/31/2010  . Abnormal weight gain 10/25/2010  . Other and unspecified hyperlipidemia 10/24/2010  . Iron deficiency anemia, unspecified 10/24/2010  . Depressive disorder, not elsewhere classified 10/24/2010  . Unspecified essential hypertension 10/24/2010  . Chronic airway obstruction, not elsewhere classified 10/24/2010  . Wrist fracture, right 10/09/2013   Past Surgical History  Procedure Laterality Date  . Cosmetic surgery  2000-2005    multiple surgeries  . Coronary artery bypass graft  2005  . Carpal tunnel release  2007  . Breast surgery  2007    Breast reduction  . Bunionectomy Bilateral 2007    No Known Allergies    Medication List       This list is accurate as of: 02/15/15 11:59 PM.  Always use your most recent med list.               aspirin 81 MG tablet  Take 81 mg by mouth daily.     ipratropium-albuterol 0.5-2.5 (3) MG/3ML Soln  Commonly known as:  DUONEB  Take 3 mLs by nebulization 2 times daily at 12 noon and 4 pm.  LORazepam 0.5 MG tablet  Commonly known as:  ATIVAN  Take 0.5 mg by mouth every 8 (eight) hours. Take one tablet every 8 hours as needed for anxiety/agitation.     NAMENDA XR 28 MG Cp24 24 hr capsule  Generic drug:  memantine  Take by mouth every morning. Take 1 daily to help maintain functional status.     polyethylene glycol packet  Commonly known as:  MIRALAX / GLYCOLAX  Take 17 g by mouth daily.       sennosides-docusate sodium 8.6-50 MG tablet  Commonly known as:  SENOKOT-S  Take 1 tablet by mouth 2 (two) times daily.     sertraline 100 MG tablet  Commonly known as:  ZOLOFT  Take 50 mg by mouth daily. Give total  once a morning        Immunization History  Administered Date(s) Administered  . Influenza Whole 05/14/2009, 02/19/2012, 02/24/2013  . Influenza-Unspecified 02/26/2014  . PPD Test 02/15/2012  . Pneumococcal Conjugate-13 12/31/2007  . Td 05/14/2008   Pertinent  Health Maintenance Due  Topic Date Due  . MAMMOGRAM  12/31/1991  . COLONOSCOPY  12/31/1991  . DEXA SCAN  12/31/2006  . PNA vac Low Risk Adult (2 of 2 - PPSV23) 12/30/2008  . INFLUENZA VACCINE  12/13/2014   Fall Risk  01/27/2015 10/22/2014  Falls in the past year? Yes Yes  Number falls in past yr: 2 or more 1  Risk Factor Category  High Fall Risk -  Risk for fall due to : History of fall(s) -    Filed Vitals:   02/08/15 1021  BP: 114/63  Pulse: 85  Resp: 18  Weight: 110 lb 8 oz (50.122 kg)  SpO2: 95%   Body mass index is 18.96 kg/(m^2). Physical Exam  Constitutional: She appears well-developed. No distress.  Staring straight forward most of the time; occasionally will follow people with her eyes; nonverbal   Cardiovascular: Normal rate, regular rhythm and normal heart sounds.   Pulmonary/Chest: Effort normal and breath sounds normal. She has no wheezes. She has no rales.  Abdominal: Soft. Bowel sounds are normal.  Neurological: She is alert.  Skin: Skin is warm and dry.    Labs reviewed:  Recent Labs  05/21/14 08/19/14  NA 143 142  K 3.8 3.9  BUN 14 13  CREATININE 0.8 0.9   No results for input(s): AST, ALT, ALKPHOS, BILITOT, PROT, ALBUMIN in the last 8760 hours.  Recent Labs  05/21/14 08/19/14  WBC 8.9 6.7  HGB 11.3* 12.1  HCT 34* 36  PLT 266 241   Lab Results  Component Value Date   TSH 3.39 06/29/2014   No results found for: HGBA1C No results found for: CHOL, HDL,  LDLCALC, LDLDIRECT, TRIG, CHOLHDL  Significant Diagnostic Results in last 30 days:  No results found.  Assessment/Plan 1. Stroke-like episode (HCC) -pt declining significantly and is now having decreased responsiveness consistent with another vascular event vs. Seizures -she is already on hospice care -her daughter has opted for comfort measures when the DON discussed this with her today -will begin roxanol  q 4 hrs prn pain or dyspnea, ativan 0.5mg  po q 4 hrs prn anxiety or dyspnea, atropine drops for increased secretions  2. Mixed Alzheimer's and vascular dementia -is now at end stages, comfort care is focus, do not hospitalize  3. Dysphagia, pharyngoesophageal phase -has been ongoing, is on modified diet, has h/o of aspirating after her stroke  4. Centrilobular emphysema (HCC) -continues with duonebs and  now add roxanol and ativan for comfort  5. Loss of weight -has been progressive over time, worse since her stroke event several months ago -due to dementia, dysphagia and emphysema  6. Failure to thrive syndrome, adult -due to dementia, cont comfort measures  7. Right foot drop -seems some of her pain is in the right leg which was affected by the stroke she had, begin roxanol for comfort  Family/ staff Communication: discussed with her daughter, memory care RN and DON; staff to call hospice immediately  Labs/tests ordered:  None due to goals of care Danija Gosa L. Dawn Kiper, D.O. Geriatrics [facility] [address], [place] [address] Cell Phone (Mon-Fri 8am-5pm):  [phone] On Call:  [phone] & follow prompts after 5pm & weekends Office Phone:  [phone] Office Fax:  [phone]

## 2015-02-20 ENCOUNTER — Encounter: Payer: Self-pay | Admitting: Internal Medicine

## 2015-03-15 DEATH — deceased

## 2015-03-24 NOTE — Progress Notes (Signed)
This encounter was created in error - please disregard.

## 2015-04-26 NOTE — Progress Notes (Signed)
This encounter was created in error - please disregard.
